# Patient Record
Sex: Female | Born: 1990 | Race: White | Hispanic: No | Marital: Single | Smoking: Former smoker
Health system: Southern US, Community
[De-identification: ages and names within clinical notes are randomized; demographics above are authoritative.]

## PROBLEM LIST (undated history)

## (undated) DIAGNOSIS — Z789 Other specified health status: Secondary | ICD-10-CM

## (undated) HISTORY — DX: Other specified health status: Z78.9

---

## 2014-11-28 LAB — OB RESULTS CONSOLE RUBELLA ANTIBODY, IGM: Rubella: IMMUNE

## 2014-11-28 LAB — OB RESULTS CONSOLE HIV ANTIBODY (ROUTINE TESTING): HIV: NONREACTIVE

## 2014-11-28 LAB — OB RESULTS CONSOLE HGB/HCT, BLOOD
HCT: 40 %
Hemoglobin: 13.4 g/dL

## 2014-11-28 LAB — OB RESULTS CONSOLE GC/CHLAMYDIA
Chlamydia: NEGATIVE
Gonorrhea: NEGATIVE

## 2014-11-28 LAB — OB RESULTS CONSOLE ABO/RH: RH TYPE: POSITIVE

## 2014-11-28 LAB — OB RESULTS CONSOLE RPR: RPR: NONREACTIVE

## 2014-11-28 LAB — OB RESULTS CONSOLE HEPATITIS B SURFACE ANTIGEN: HEP B S AG: NEGATIVE

## 2014-11-28 LAB — OB RESULTS CONSOLE ANTIBODY SCREEN: Antibody Screen: NEGATIVE

## 2015-04-06 LAB — OB RESULTS CONSOLE HGB/HCT, BLOOD: Hemoglobin: 10.4 g/dL

## 2015-05-12 ENCOUNTER — Encounter: Payer: Self-pay | Admitting: *Deleted

## 2015-05-12 DIAGNOSIS — Z3490 Encounter for supervision of normal pregnancy, unspecified, unspecified trimester: Secondary | ICD-10-CM

## 2015-05-12 DIAGNOSIS — Z98891 History of uterine scar from previous surgery: Secondary | ICD-10-CM | POA: Insufficient documentation

## 2015-05-12 DIAGNOSIS — E079 Disorder of thyroid, unspecified: Secondary | ICD-10-CM

## 2015-05-16 ENCOUNTER — Ambulatory Visit (INDEPENDENT_AMBULATORY_CARE_PROVIDER_SITE_OTHER): Payer: Medicaid Other | Admitting: Advanced Practice Midwife

## 2015-05-16 ENCOUNTER — Encounter: Payer: Self-pay | Admitting: Advanced Practice Midwife

## 2015-05-16 VITALS — BP 102/67 | HR 75 | Temp 98.5°F | Ht 63.0 in | Wt 127.5 lb

## 2015-05-16 DIAGNOSIS — Z3493 Encounter for supervision of normal pregnancy, unspecified, third trimester: Secondary | ICD-10-CM | POA: Diagnosis not present

## 2015-05-16 DIAGNOSIS — O219 Vomiting of pregnancy, unspecified: Secondary | ICD-10-CM

## 2015-05-16 DIAGNOSIS — Z23 Encounter for immunization: Secondary | ICD-10-CM | POA: Diagnosis not present

## 2015-05-16 DIAGNOSIS — O34219 Maternal care for unspecified type scar from previous cesarean delivery: Secondary | ICD-10-CM

## 2015-05-16 LAB — POCT URINALYSIS DIP (DEVICE)
Bilirubin Urine: NEGATIVE
GLUCOSE, UA: NEGATIVE mg/dL
HGB URINE DIPSTICK: NEGATIVE
KETONES UR: NEGATIVE mg/dL
Nitrite: NEGATIVE
Protein, ur: NEGATIVE mg/dL
SPECIFIC GRAVITY, URINE: 1.01 (ref 1.005–1.030)
Urobilinogen, UA: 0.2 mg/dL (ref 0.0–1.0)
pH: 7 (ref 5.0–8.0)

## 2015-05-16 MED ORDER — ONDANSETRON 4 MG PO TBDP: 4.0000 mg | ORAL_TABLET | Freq: Three times a day (TID) | ORAL | Status: DC | PRN

## 2015-05-16 MED ORDER — TETANUS-DIPHTH-ACELL PERTUSSIS 5-2.5-18.5 LF-MCG/0.5 IM SUSP
0.5000 mL | Freq: Once | INTRAMUSCULAR | Status: AC
  Administered 2015-05-16: 0.5 mL via INTRAMUSCULAR

## 2015-05-16 NOTE — Patient Instructions (Addendum)
Back Pain in Pregnancy Back pain during pregnancy is common. It happens in about half of all pregnancies. It is important for you and your baby that you remain active during your pregnancy.If you feel that back pain is not allowing you to remain active or sleep well, it is time to see your caregiver. Back pain may be caused by several factors related to changes during your pregnancy.Fortunately, unless you had trouble with your back before your pregnancy, the pain is likely to get better after you deliver. Low back pain usually occurs between the fifth and seventh months of pregnancy. It can, however, happen in the first couple months. Factors that increase the risk of back problems include:   Previous back problems.  Injury to your back.  Having twins or multiple births.  A chronic cough.  Stress.  Job-related repetitive motions.  Muscle or spinal disease in the back.  Family history of back problems, ruptured (herniated) discs, or osteoporosis.  Depression, anxiety, and panic attacks. CAUSES   When you are pregnant, your body produces a hormone called relaxin. This hormonemakes the ligaments connecting the low back and pubic bones more flexible. This flexibility allows the baby to be delivered more easily. When your ligaments are loose, your muscles need to work harder to support your back. Soreness in your back can come from tired muscles. Soreness can also come from back tissues that are irritated since they are receiving less support.  As the baby grows, it puts pressure on the nerves and blood vessels in your pelvis. This can cause back pain.  As the baby grows and gets heavier during pregnancy, the uterus pushes the stomach muscles forward and changes your center of gravity. This makes your back muscles work harder to maintain good posture. SYMPTOMS  Lumbar pain during pregnancy Lumbar pain during pregnancy usually occurs at or above the waist in the center of the back. There  may be pain and numbness that radiates into your leg or foot. This is similar to low back pain experienced by non-pregnant women. It usually increases with sitting for long periods of time, standing, or repetitive lifting. Tenderness may also be present in the muscles along your upper back. Posterior pelvic pain during pregnancy Pain in the back of the pelvis is more common than lumbar pain in pregnancy. It is a deep pain felt in your side at the waistline, or across the tailbone (sacrum), or in both places. You may have pain on one or both sides. This pain can also go into the buttocks and backs of the upper thighs. Pubic and groin pain may also be present. The pain does not quickly resolve with rest, and morning stiffness may also be present. Pelvic pain during pregnancy can be brought on by most activities. A high level of fitness before and during pregnancy may or may not prevent this problem. Labor pain is usually 1 to 2 minutes apart, lasts for about 1 minute, and involves a bearing down feeling or pressure in your pelvis. However, if you are at term with the pregnancy, constant low back pain can be the beginning of early labor, and you should be aware of this. DIAGNOSIS  X-rays of the back should not be done during the first 12 to 14 weeks of the pregnancy and only when absolutely necessary during the rest of the pregnancy. MRIs do not give off radiation and are safe during pregnancy. MRIs also should only be done when absolutely necessary. HOME CARE INSTRUCTIONS  Exercise   as directed by your caregiver. Exercise is the most effective way to prevent or manage back pain. If you have a back problem, it is especially important to avoid sports that require sudden body movements. Swimming and walking are great activities.  Do not stand in one place for long periods of time.  Do not wear high heels.  Sit in chairs with good posture. Use a pillow on your lower back if necessary. Make sure your head  rests over your shoulders and is not hanging forward.  Try sleeping on your side, preferably the left side, with a pillow or two between your legs. If you are sore after a night's rest, your bedmay betoo soft.Try placing a board between your mattress and box spring.  Listen to your body when lifting.If you are experiencing pain, ask for help or try bending yourknees more so you can use your leg muscles rather than your back muscles. Squat down when picking up something from the floor. Do not bend over.  Eat a healthy diet. Try to gain weight within your caregiver's recommendations.  Use heat or cold packs 3 to 4 times a day for 15 minutes to help with the pain.  Only take over-the-counter or prescription medicines for pain, discomfort, or fever as directed by your caregiver. Sudden (acute) back pain  Use bed rest for only the most extreme, acute episodes of back pain. Prolonged bed rest over 48 hours will aggravate your condition.  Ice is very effective for acute conditions.  Put ice in a plastic bag.  Place a towel between your skin and the bag.  Leave the ice on for 10 to 20 minutes every 2 hours, or as needed.  Using heat packs for 30 minutes prior to activities is also helpful. Continued back pain See your caregiver if you have continued problems. Your caregiver can help or refer you for appropriate physical therapy. With conditioning, most back problems can be avoided. Sometimes, a more serious issue may be the cause of back pain. You should be seen right away if new problems seem to be developing. Your caregiver may recommend:  A maternity girdle.  An elastic sling.  A back brace.  A massage therapist or acupuncture. SEEK MEDICAL CARE IF:   You are not able to do most of your daily activities, even when taking the pain medicine you were given.  You need a referral to a physical therapist or chiropractor.  You want to try acupuncture. SEEK IMMEDIATE MEDICAL CARE  IF:  You develop numbness, tingling, weakness, or problems with the use of your arms or legs.  You develop severe back pain that is no longer relieved with medicines.  You have a sudden change in bowel or bladder control.  You have increasing pain in other areas of the body.  You develop shortness of breath, dizziness, or fainting.  You develop nausea, vomiting, or sweating.  You have back pain which is similar to labor pains.  You have back pain along with your water breaking or vaginal bleeding.  You have back pain or numbness that travels down your leg.  Your back pain developed after you fell.  You develop pain on one side of your back. You may have a kidney stone.  You see blood in your urine. You may have a bladder infection or kidney stone.  You have back pain with blisters. You may have shingles. Back pain is fairly common during pregnancy but should not be accepted as just part of   the process. Back pain should always be treated as soon as possible. This will make your pregnancy as pleasant as possible.   This information is not intended to replace advice given to you by your health care provider. Make sure you discuss any questions you have with your health care provider.   Document Released: 10/15/2005 Document Revised: 09/29/2011 Document Reviewed: 11/26/2010 Elsevier Interactive Patient Education 2016 [employer].  Tdap Vaccine (Tetanus, Diphtheria and Pertussis): What You Need to Know 1. Why get vaccinated? Tetanus, diphtheria and pertussis are very serious diseases. Tdap vaccine can protect [place] from these diseases. And, Tdap vaccine given to pregnant women can protect newborn babies against pertussis. TETANUS (Lockjaw) is rare in the [place] today. It causes painful muscle tightening and stiffness, usually all over the body.  It can lead to tightening of muscles in the head and neck so you can't open your mouth, swallow, or sometimes even breathe. Tetanus  kills about 1 out of 10 people who are infected even after receiving the best medical care. DIPHTHERIA is also rare in the [place] today. It can cause a thick coating to form in the back of the throat.  It can lead to breathing problems, heart failure, paralysis, and death. PERTUSSIS (Whooping Cough) causes severe coughing spells, which can cause difficulty breathing, vomiting and disturbed sleep.  It can also lead to weight loss, incontinence, and rib fractures. Up to 2 in 100 adolescents and 5 in 100 adults with pertussis are hospitalized or have complications, which could include pneumonia or death. These diseases are caused by bacteria. Diphtheria and pertussis are spread from person to person through secretions from coughing or sneezing. Tetanus enters the body through cuts, scratches, or wounds. Before vaccines, as many as 200,000 cases of diphtheria, 200,000 cases of pertussis, and hundreds of cases of tetanus, were reported in the [place] each year. Since vaccination began, reports of cases for tetanus and diphtheria have dropped by about 99% and for pertussis by about 80%. 2. Tdap vaccine Tdap vaccine can protect adolescents and adults from tetanus, diphtheria, and pertussis. One dose of Tdap is routinely given at age 78 or 64. People who did not get Tdap at that age should get it as soon as possible. Tdap is especially important for healthcare professionals and anyone having close contact with a baby younger than 12 months. Pregnant women should get a dose of Tdap during every pregnancy, to protect the newborn from pertussis. Infants are most at risk for severe, life-threatening complications from pertussis. Another vaccine, called Td, protects against tetanus and diphtheria, but not pertussis. A Td booster should be given every 10 years. Tdap may be given as one of these boosters if you have never gotten Tdap before. Tdap may also be given after a severe cut or burn to prevent  tetanus infection. Your doctor or the person giving you the vaccine can give you more information. Tdap may safely be given at the same time as other vaccines. 3. Some people should not get this vaccine  A person who has ever had a life-threatening allergic reaction after a previous dose of any diphtheria, tetanus or pertussis containing vaccine, OR has a severe allergy to any part of this vaccine, should not get Tdap vaccine. Tell the person giving the vaccine about any severe allergies.  Anyone who had coma or long repeated seizures within 7 days after a childhood dose of DTP or DTaP, or a previous dose of Tdap, should not get Tdap,  unless a cause other than the vaccine was found. They can still get Td.  Talk to your doctor if you:  have seizures or another nervous system problem,  had severe pain or swelling after any vaccine containing diphtheria, tetanus or pertussis,  ever had a condition called Guillain-Barr Syndrome (GBS),  aren't feeling well on the day the shot is scheduled. 4. Risks With any medicine, including vaccines, there is a chance of side effects. These are usually mild and go away on their own. Serious reactions are also possible but are rare. Most people who get Tdap vaccine do not have any problems with it. Mild problems following Tdap (Did not interfere with activities)  Pain where the shot was given (about 3 in 4 adolescents or 2 in 3 adults)  Redness or swelling where the shot was given (about 1 person in 5)  Mild fever of at least 100.52F (up to about 1 in 25 adolescents or 1 in 100 adults)  Headache (about 3 or 4 people in 10)  Tiredness (about 1 person in 3 or 4)  Nausea, vomiting, diarrhea, stomach ache (up to 1 in 4 adolescents or 1 in 10 adults)  Chills, sore joints (about 1 person in 10)  Body aches (about 1 person in 3 or 4)  Rash, swollen glands (uncommon) Moderate problems following Tdap (Interfered with activities, but did not require  medical attention)  Pain where the shot was given (up to 1 in 5 or 6)  Redness or swelling where the shot was given (up to about 1 in 16 adolescents or 1 in 12 adults)  Fever over 102F (about 1 in 100 adolescents or 1 in 250 adults)  Headache (about 1 in 7 adolescents or 1 in 10 adults)  Nausea, vomiting, diarrhea, stomach ache (up to 1 or 3 people in 100)  Swelling of the entire arm where the shot was given (up to about 1 in 500). Severe problems following Tdap (Unable to perform usual activities; required medical attention)  Swelling, severe pain, bleeding and redness in the arm where the shot was given (rare). Problems that could happen after any vaccine:  People sometimes faint after a medical procedure, including vaccination. Sitting or lying down for about 15 minutes can help prevent fainting, and injuries caused by a fall. Tell your doctor if you feel dizzy, or have vision changes or ringing in the ears.  Some people get severe pain in the shoulder and have difficulty moving the arm where a shot was given. This happens very rarely.  Any medication can cause a severe allergic reaction. Such reactions from a vaccine are very rare, estimated at fewer than 1 in a million doses, and would happen within a few minutes to a few hours after the vaccination. As with any medicine, there is a very remote chance of a vaccine causing a serious injury or death. The safety of vaccines is always being monitored. For more information, visit: http://floyd.org/ 5. What if there is a serious problem? What should I look for?  Look for anything that concerns you, such as signs of a severe allergic reaction, very high fever, or unusual behavior.  Signs of a severe allergic reaction can include hives, swelling of the face and throat, difficulty breathing, a fast heartbeat, dizziness, and weakness. These would usually start a few minutes to a few hours after the vaccination. What should I  do?  If you think it is a severe allergic reaction or other emergency that can't wait,  call 9-1-1 or get the person to the nearest hospital. Otherwise, call your doctor.  Afterward, the reaction should be reported to the Vaccine Adverse Event Reporting System (VAERS). Your doctor might file this report, or you can do it yourself through the VAERS web site at www.vaers.LAgents.nohhs.gov, or by calling 1-(613)858-2492. VAERS does not give medical advice.  6. The National Vaccine Injury Compensation Program The [employer] Vaccine Injury Compensation Program (VICP) is a federal program that was created to compensate people who may have been injured by certain vaccines. Persons who believe they may have been injured by a vaccine can learn about the program and about filing a claim by calling 1-661 787 9803 or visiting the VICP website at SpiritualWord.atwww.hrsa.gov/vaccinecompensation. There is a time limit to file a claim for compensation. 7. How can I learn more?  Ask your doctor. He or she can give you the vaccine package insert or suggest other sources of information.  Call your local or state health department.  Contact the Centers for Disease Control and Prevention (CDC):  Call 450-483-67401-712-522-4098 (1-800-CDC-INFO) or  Visit CDC's website at PicCapture.uywww.cdc.gov/vaccines CDC Tdap Vaccine VIS (09/13/13)   This information is not intended to replace advice given to you by your health care provider. Make sure you discuss any questions you have with your health care provider.   Document Released: 01/06/2012 Document Revised: 07/28/2014 Document Reviewed: 10/19/2013 Elsevier Interactive Patient Education 2016 Elsevier Inc.  Pregnancy and Influenza Influenza, also called the flu, is an infection of the respiratory tract. If you are pregnant, you are more likely to catch the flu. You are also more likely to have a more serious case of the flu. This is because pregnancy lowers your body's ability to fight off infections (it weakens your  immune system). It also puts additional stress on your heart and lungs, which makes you more likely to have complications. Having a bad case of the flu, especially with a high fever, can be dangerous for your developing baby. It can cause you to go into early labor. HOW DO PEOPLE GET THE FLU? The flu is caused by the influenza virus. This virus is common every year in the fall and winter. It spreads when virus particles get passed from person to person. You can get the virus if you are near a sick person who is coughing or sneezing. You can also get the virus if you touch something that has the virus on it and then touch your face. HOW CAN I PROTECT MYSELF AGAINST THE FLU?  Get a flu shot. The best way to prevent the flu is to get a flu shot before flu season starts. The flu shot is not dangerous for your developing baby. It may even help protect your baby from the flu for up to 6 months after birth. The flu shot is one type of flu vaccine. Another type is a nasal spray vaccine. Do not get the nasal spray vaccine. It is not approved for pregnancy.  Do not come in close contact with sick people.  Do not share food, drinks, or utensils with other people.  Wash your hands often. Use hand sanitizer when soap and water are not available. WHAT SHOULD I DO IF I HAVE FLU SYMPTOMS? If you have any flu symptoms, call your health care provider right away. Flu symptoms include:  Fever or chills.  Muscle aches.  Headache.  Sore throat.  Nasal congestion.  Cough.  Feeling tired.  Loss of appetite.  Vomiting.  Diarrhea. You may be  able to take an antiviral medicine to keep the flu from becoming severe and to shorten how long it lasts. WHAT SHOULD I DO AT HOME IF I AM DIAGNOSED WITH THE FLU?  Do not take any medicine, including cold or flu medicine, unless directed by your health care provider.  If you take antiviral medicine, make sure you finish it even if you start to feel better.  Drink  enough fluid to keep your urine clear or pale yellow.  Get plenty of rest. WHEN WOULD I SEEK IMMEDIATE MEDICAL CARE IF I HAVE THE FLU?  You have trouble breathing.  You have chest pain.  You begin to have labor pains.  You have a high fever that does not go down after you take medicine.  You do not feel your baby move.  You have diarrhea or vomiting that will not go away.   This information is not intended to replace advice given to you by your health care provider. Make sure you discuss any questions you have with your health care provider.   Document Released: 05/09/2008 Document Revised: 07/12/2013 Document Reviewed: 06/03/2013 Elsevier Interactive Patient Education [employer]

## 2015-05-16 NOTE — Progress Notes (Signed)
Initial and 28 week prenatal education packets given Breastfeeding tip of the week reviewed Flu and tdap today

## 2015-05-16 NOTE — Progress Notes (Signed)
Subjective:  Sara Padilla is a [age] y.o. G3P2002 at 759w3d being seen today for new OB transfer from [place]. Records reviewed.  Patient reports low back pain.  Contractions: Not present.  Vag. Bleeding: None. Movement: Present. Denies leaking of fluid.   The following portions of the patient's history were reviewed and updated as appropriate: allergies, current medications, past family history, past medical history, past social history, past surgical history and problem list. Problem list updated.  1 hour GTT elevated at 135.   Objective:   Filed Vitals:   05/16/15 0834 05/16/15 0839  BP: 102/67   Pulse: 75   Temp: 98.5 F (36.9 C)   Height:  5\' 3"  (1.6 m)  Weight: 127 lb 8 oz (57.834 kg)     Fetal Status: Fetal Heart Rate (bpm): 148   Movement: Present     General:  Alert, oriented and cooperative. Patient is in no acute distress.  Skin: Skin is warm and dry. No rash noted.   Cardiovascular: Normal heart rate noted  Respiratory: Normal respiratory effort, no problems with respiration noted  Abdomen: Soft, gravid, appropriate for gestational age. Pain/Pressure: Present     Pelvic: Vag. Bleeding: None     Cervical exam deferred        Extremities: Normal range of motion.  Edema: None  Mental Status: Normal mood and affect. Normal behavior. Normal judgment and thought content.   Urinalysis: Urine Protein: Negative Urine Glucose: Negative  Assessment and Plan:  Pregnancy: G3P2002 at 1079w3d  1. Supervision of normal pregnancy, third trimester   2. Previous cesarean delivery, antepartum condition or complication  Scheduled 3 hour GTT, RPR, HIV Preterm labor symptoms and general obstetric precautions including but not limited to vaginal bleeding, contractions, leaking of fluid and fetal movement were reviewed in detail with the patient. Please refer to After Visit Summary for other counseling recommendations.  Return in about 2 weeks (around 05/30/2015) for 3 hour GTT ASAP.   Back pain comfort measures. Maternity support. TDaP and Flu  Dorathy KinsmanVirginia Jeanie Mccard, CNM

## 2015-05-18 ENCOUNTER — Other Ambulatory Visit: Payer: Medicaid Other

## 2015-05-18 DIAGNOSIS — O24112 Pre-existing diabetes mellitus, type 2, in pregnancy, second trimester: Secondary | ICD-10-CM

## 2015-05-19 LAB — GLUCOSE TOLERANCE, 3 HOURS
GLUCOSE, 1 HOUR-GESTATIONAL: 115 mg/dL (ref 70–189)
Glucose Tolerance, 2 hour: 105 mg/dL (ref 70–164)
Glucose Tolerance, Fasting: 77 mg/dL (ref 65–99)
Glucose, GTT - 3 Hour: 83 mg/dL (ref 70–144)

## 2015-05-30 ENCOUNTER — Ambulatory Visit (INDEPENDENT_AMBULATORY_CARE_PROVIDER_SITE_OTHER): Payer: Medicaid Other | Admitting: Certified Nurse Midwife

## 2015-05-30 VITALS — BP 110/73 | HR 89 | Temp 98.1°F | Wt 127.4 lb

## 2015-05-30 DIAGNOSIS — O26843 Uterine size-date discrepancy, third trimester: Secondary | ICD-10-CM

## 2015-05-30 DIAGNOSIS — O1213 Gestational proteinuria, third trimester: Secondary | ICD-10-CM

## 2015-05-30 DIAGNOSIS — O365930 Maternal care for other known or suspected poor fetal growth, third trimester, not applicable or unspecified: Secondary | ICD-10-CM

## 2015-05-30 DIAGNOSIS — O1210 Gestational proteinuria, unspecified trimester: Secondary | ICD-10-CM

## 2015-05-30 LAB — POCT URINALYSIS DIP (DEVICE)
Glucose, UA: NEGATIVE mg/dL
HGB URINE DIPSTICK: NEGATIVE
Ketones, ur: NEGATIVE mg/dL
NITRITE: NEGATIVE
PH: 7.5 (ref 5.0–8.0)
PROTEIN: 100 mg/dL — AB
SPECIFIC GRAVITY, URINE: 1.02 (ref 1.005–1.030)
UROBILINOGEN UA: 0.2 mg/dL (ref 0.0–1.0)

## 2015-05-30 LAB — CBC
HCT: 29.4 % — ABNORMAL LOW (ref 36.0–46.0)
Hemoglobin: 10.1 g/dL — ABNORMAL LOW (ref 12.0–15.0)
MCH: 30.7 pg (ref 26.0–34.0)
MCHC: 34.4 g/dL (ref 30.0–36.0)
MCV: 89.4 fL (ref 78.0–100.0)
MPV: 8.9 fL (ref 8.6–12.4)
Platelets: 361 10*3/uL (ref 150–400)
RBC: 3.29 MIL/uL — ABNORMAL LOW (ref 3.87–5.11)
RDW: 13.1 % (ref 11.5–15.5)
WBC: 13.8 10*3/uL — ABNORMAL HIGH (ref 4.0–10.5)

## 2015-05-30 LAB — COMPLETE METABOLIC PANEL WITH GFR
ALT: 6 U/L (ref 6–29)
AST: 11 U/L (ref 10–30)
Albumin: 3.5 g/dL — ABNORMAL LOW (ref 3.6–5.1)
Alkaline Phosphatase: 183 U/L — ABNORMAL HIGH (ref 33–115)
BUN: 4 mg/dL — ABNORMAL LOW (ref 7–25)
CO2: 25 mmol/L (ref 20–31)
Calcium: 8.6 mg/dL (ref 8.6–10.2)
Chloride: 102 mmol/L (ref 98–110)
Creat: 0.43 mg/dL — ABNORMAL LOW (ref 0.50–1.10)
GFR, Est African American: >89 mL/min (ref >=60)
GFR, Est Non African American: >89 mL/min (ref >=60)
Glucose, Bld: 81 mg/dL (ref 65–99)
Potassium: 4.4 mmol/L (ref 3.5–5.3)
Sodium: 135 mmol/L (ref 135–146)
Total Bilirubin: 0.3 mg/dL (ref 0.2–1.2)
Total Protein: 6.3 g/dL (ref 6.1–8.1)

## 2015-05-30 NOTE — Progress Notes (Cosign Needed)
Subjective:  Sara Padilla is a 24 y.o. G3P2002 at 3124w3d being seen today for ongoing prenatal care.  Patient reports Mild intermittent pain in her side and mild fatigue which has been present for a few weeks.  Contractions: Irregular.  Vag. Bleeding: None. Movement: Present. Denies leaking of fluid.   History of "Tyroid disease" as per chart: Patient doesn't know if hypo or hyper. States that 2-3 years ago she was taking thyroid medication but does not know what it was. Currently she does have some cold and hot intolerance, fatigue, difficulty gaining weight, lack of appetite and some mild intermittent anxiety. She denies any depression or diarrhea/constipation.   The patient does suffer from some minor chronic headaches but denies blurred vision, diplopia, dizziness, balance issues, weakness, abdominal pain, bloody/black stool, palpitations, chest pain, shortness of breath, cough, dysuria, abnormal urinary frequency, vaginal discharge/odor/itchiness/fluid leakage/blood. She does have sporadic contractions that come and go infrequently.   The following portions of the patient's history were reviewed and updated as appropriate: allergies, current medications, past family history, past medical history, past social history, past surgical history and problem list. Problem list updated.  Objective:   Filed Vitals:   05/30/15 0854  BP: 110/73  Pulse: 89  Temp: 98.1 F (36.7 C)  Weight: 127 lb 6.4 oz (57.788 kg)    Fetal Status: Fetal Heart Rate (bpm): 158 Fundal Height: 28 cm Movement: Present     General:  Alert, oriented and cooperative. Patient is in no acute distress.  Skin: Skin is warm and dry. No rash noted.   Cardiovascular: Normal heart rate noted  Respiratory: Normal respiratory effort, no problems with respiration noted  Abdomen: Soft, gravid, appropriate for gestational age. Pain/Pressure: Present     Pelvic: Vag. Bleeding: None     Cervical exam deferred        Extremities:  Normal range of motion.  Edema: None  Mental Status: Normal mood and affect. Normal behavior. Normal judgment and thought content.        Neurological:  Reflexes 2+, no clonus, CN2-12 intact, normal strength and ROM in extremities. No tremor noted  Urinalysis: Urine Protein: 2+ Urine Glucose: Negative  Assessment and Plan:  Pregnancy: G3P2002 at 1924w3d  1. Proteinuria affecting pregnancy: - CBC - COMPLETE METABOLIC PANEL WITH GFR - Protein / Creatinine Ratio, Urine - Thyroid Panel [id number]  2. Significant discrepancy between uterine size and clinical dates, antepartum, third trimester - US MFM OB FOLLOW UP; Future  Term labor symptoms and general obstetric precautions including but not limited to vaginal bleeding, contractions, leaking of fluid and fetal movement were reviewed in detail with the patient. Please refer to After Visit Summary for other counseling recommendations.  Return in about 2 weeks (around 06/13/2015).   Janett LabellaJohannes L Du Pisanie, Med Student

## 2015-05-30 NOTE — Progress Notes (Signed)
Breastfeeding tip of the week reviewed. 

## 2015-05-30 NOTE — Patient Instructions (Signed)
Thanks for coming in today. We did some blood tests to see if there is anything wrong because you had some protein in your urine We scheduled you for an ultrasound to check on the size of the baby. Thank you again for coming in and see you back in 2 weeks.   Third Trimester of Pregnancy The third trimester is from week 29 through week 42, months 7 through 9. The third trimester is a time when the fetus is growing rapidly. At the end of the ninth month, the fetus is about 20 inches in length and weighs 6-10 pounds.  BODY CHANGES Your body goes through many changes during pregnancy. The changes vary from woman to woman.   Your weight will continue to increase. You can expect to gain 25-35 pounds (11-16 kg) by the end of the pregnancy.  You may begin to get stretch marks on your hips, abdomen, and breasts.  You may urinate more often because the fetus is moving lower into your pelvis and pressing on your bladder.  You may develop or continue to have heartburn as a result of your pregnancy.  You may develop constipation because certain hormones are causing the muscles that push waste through your intestines to slow down.  You may develop hemorrhoids or swollen, bulging veins (varicose veins).  You may have pelvic pain because of the weight gain and pregnancy hormones relaxing your joints between the bones in your pelvis. Backaches may result from overexertion of the muscles supporting your posture.  You may have changes in your hair. These can include thickening of your hair, rapid growth, and changes in texture. Some women also have hair loss during or after pregnancy, or hair that feels dry or thin. Your hair will most likely return to normal after your baby is born.  Your breasts will continue to grow and be tender. A yellow discharge may leak from your breasts called colostrum.  Your belly button may stick out.  You may feel short of breath because of your expanding uterus.  You may  notice the fetus "dropping," or moving lower in your abdomen.  You may have a bloody mucus discharge. This usually occurs a few days to a week before labor begins.  Your cervix becomes thin and soft (effaced) near your due date. WHAT TO EXPECT AT YOUR PRENATAL EXAMS  You will have prenatal exams every 2 weeks until week 36. Then, you will have weekly prenatal exams. During a routine prenatal visit:  You will be weighed to make sure you and the fetus are growing normally.  Your blood pressure is taken.  Your abdomen will be measured to track your baby's growth.  The fetal heartbeat will be listened to.  Any test results from the previous visit will be discussed.  You may have a cervical check near your due date to see if you have effaced. At around 36 weeks, your caregiver will check your cervix. At the same time, your caregiver will also perform a test on the secretions of the vaginal tissue. This test is to determine if a type of bacteria, Group B streptococcus, is present. Your caregiver will explain this further. Your caregiver may ask you:  What your birth plan is.  How you are feeling.  If you are feeling the baby move.  If you have had any abnormal symptoms, such as leaking fluid, bleeding, severe headaches, or abdominal cramping.  If you are using any tobacco products, including cigarettes, chewing tobacco, and electronic cigarettes.  If you have any questions. Other tests or screenings that may be performed during your third trimester include:  Blood tests that check for low iron levels (anemia).  Fetal testing to check the health, activity level, and growth of the fetus. Testing is done if you have certain medical conditions or if there are problems during the pregnancy.  HIV (human immunodeficiency virus) testing. If you are at high risk, you may be screened for HIV during your third trimester of pregnancy. FALSE LABOR You may feel small, irregular contractions that  eventually go away. These are called Braxton Hicks contractions, or false labor. Contractions may last for hours, days, or even weeks before true labor sets in. If contractions come at regular intervals, intensify, or become painful, it is best to be seen by your caregiver.  SIGNS OF LABOR   Menstrual-like cramps.  Contractions that are 5 minutes apart or less.  Contractions that start on the top of the uterus and spread down to the lower abdomen and back.  A sense of increased pelvic pressure or back pain.  A watery or bloody mucus discharge that comes from the vagina. If you have any of these signs before the 37th week of pregnancy, call your caregiver right away. You need to go to the hospital to get checked immediately. HOME CARE INSTRUCTIONS   Avoid all smoking, herbs, alcohol, and unprescribed drugs. These chemicals affect the formation and growth of the baby.  Do not use any tobacco products, including cigarettes, chewing tobacco, and electronic cigarettes. If you need help quitting, ask your health care provider. You may receive counseling support and other resources to help you quit.  Follow your caregiver's instructions regarding medicine use. There are medicines that are either safe or unsafe to take during pregnancy.  Exercise only as directed by your caregiver. Experiencing uterine cramps is a good sign to stop exercising.  Continue to eat regular, healthy meals.  Wear a good support bra for breast tenderness.  Do not use hot tubs, steam rooms, or saunas.  Wear your seat belt at all times when driving.  Avoid raw meat, uncooked cheese, cat litter boxes, and soil used by cats. These carry germs that can cause birth defects in the baby.  Take your prenatal vitamins.  Take 1500-2000 mg of calcium daily starting at the 20th week of pregnancy until you deliver your baby.  Try taking a stool softener (if your caregiver approves) if you develop constipation. Eat more  high-fiber foods, such as fresh vegetables or fruit and whole grains. Drink plenty of fluids to keep your urine clear or pale yellow.  Take warm sitz baths to soothe any pain or discomfort caused by hemorrhoids. Use hemorrhoid cream if your caregiver approves.  If you develop varicose veins, wear support hose. Elevate your feet for 15 minutes, 3-4 times a day. Limit salt in your diet.  Avoid heavy lifting, wear low heal shoes, and practice good posture.  Rest a lot with your legs elevated if you have leg cramps or low back pain.  Visit your dentist if you have not gone during your pregnancy. Use a soft toothbrush to brush your teeth and be gentle when you floss.  A sexual relationship may be continued unless your caregiver directs you otherwise.  Do not travel far distances unless it is absolutely necessary and only with the approval of your caregiver.  Take prenatal classes to understand, practice, and ask questions about the labor and delivery.  Make a trial run  to the hospital.  Pack your hospital bag.  Prepare the baby's nursery.  Continue to go to all your prenatal visits as directed by your caregiver. SEEK MEDICAL CARE IF:  You are unsure if you are in labor or if your water has broken.  You have dizziness.  You have mild pelvic cramps, pelvic pressure, or nagging pain in your abdominal area.  You have persistent nausea, vomiting, or diarrhea.  You have a bad smelling vaginal discharge.  You have pain with urination. SEEK IMMEDIATE MEDICAL CARE IF:   You have a fever.  You are leaking fluid from your vagina.  You have spotting or bleeding from your vagina.  You have severe abdominal cramping or pain.  You have rapid weight loss or gain.  You have shortness of breath with chest pain.  You notice sudden or extreme swelling of your face, hands, ankles, feet, or legs.  You have not felt your baby move in over an hour.  You have severe headaches that do not go  away with medicine.  You have vision changes.   This information is not intended to replace advice given to you by your health care provider. Make sure you discuss any questions you have with your health care provider.   Document Released: 07/01/2001 Document Revised: 07/28/2014 Document Reviewed: 09/07/2012 Elsevier Interactive Patient Education [employer]

## 2015-05-30 NOTE — Progress Notes (Signed)
Student note reviewed. Agrred with assessment. Present in room during visit Illene BolusLori Rashauna Tep CNM

## 2015-05-31 LAB — PROTEIN / CREATININE RATIO, URINE
Creatinine, Urine: 240 mg/dL (ref 20–320)
Protein Creatinine Ratio: 613 mg/g creat — ABNORMAL HIGH (ref 21–161)
Total Protein, Urine: 147 mg/dL — ABNORMAL HIGH (ref 5–24)

## 2015-05-31 LAB — THYROID PANEL CP3058
Free T4: 0.85 ng/dL (ref 0.80–1.80)
T3, Free: 3.2 pg/mL (ref 2.3–4.2)
TSH: 1.445 u[IU]/mL (ref 0.350–4.500)
Thyroglobulin Ab: <1 IU/mL (ref <2)
Thyroperoxidase Ab SerPl-aCnc: 14 IU/mL — ABNORMAL HIGH (ref <9)

## 2015-06-05 ENCOUNTER — Ambulatory Visit (HOSPITAL_COMMUNITY)
Admission: RE | Admit: 2015-06-05 | Discharge: 2015-06-05 | Disposition: A | Discharge status: Home / Self Care | Payer: Medicaid Other | Source: Ambulatory Visit | Attending: Certified Nurse Midwife | Admitting: Certified Nurse Midwife

## 2015-06-05 ENCOUNTER — Other Ambulatory Visit: Payer: Self-pay | Admitting: Certified Nurse Midwife

## 2015-06-05 DIAGNOSIS — O26843 Uterine size-date discrepancy, third trimester: Secondary | ICD-10-CM | POA: Diagnosis not present

## 2015-06-05 DIAGNOSIS — E079 Disorder of thyroid, unspecified: Secondary | ICD-10-CM

## 2015-06-05 DIAGNOSIS — Z3A35 35 weeks gestation of pregnancy: Secondary | ICD-10-CM | POA: Diagnosis not present

## 2015-06-05 DIAGNOSIS — O1210 Gestational proteinuria, unspecified trimester: Secondary | ICD-10-CM

## 2015-06-05 DIAGNOSIS — O34219 Maternal care for unspecified type scar from previous cesarean delivery: Secondary | ICD-10-CM

## 2015-06-05 DIAGNOSIS — Z3689 Encounter for other specified antenatal screening: Secondary | ICD-10-CM

## 2015-06-05 DIAGNOSIS — Z36 Encounter for antenatal screening of mother: Secondary | ICD-10-CM | POA: Diagnosis present

## 2015-06-05 DIAGNOSIS — O99280 Endocrine, nutritional and metabolic diseases complicating pregnancy, unspecified trimester: Secondary | ICD-10-CM

## 2015-06-05 DIAGNOSIS — O1213 Gestational proteinuria, third trimester: Secondary | ICD-10-CM | POA: Insufficient documentation

## 2015-06-12 ENCOUNTER — Telehealth: Payer: Self-pay | Admitting: *Deleted

## 2015-06-12 NOTE — Telephone Encounter (Signed)
Per Illene BolusLori Clemmons, CNM on 06/11/15  need to collect 24 hour urine appointment . Called [facility] to notify her and tell her since she has an appointment tomorrow we will give her instructions then and have her bring the urine back next week since we are closed for the holiday Thursday and Friday this week.

## 2015-06-13 ENCOUNTER — Encounter (HOSPITAL_COMMUNITY): Payer: Self-pay | Admitting: *Deleted

## 2015-06-13 ENCOUNTER — Ambulatory Visit (INDEPENDENT_AMBULATORY_CARE_PROVIDER_SITE_OTHER): Payer: Medicaid Other | Admitting: Advanced Practice Midwife

## 2015-06-13 ENCOUNTER — Other Ambulatory Visit (HOSPITAL_COMMUNITY)
Admission: RE | Admit: 2015-06-13 | Discharge: 2015-06-13 | Disposition: A | Discharge status: Home / Self Care | Payer: Medicaid Other | Source: Ambulatory Visit | Attending: Advanced Practice Midwife | Admitting: Advanced Practice Midwife

## 2015-06-13 VITALS — BP 114/68 | HR 87 | Temp 98.4°F | Wt 131.0 lb

## 2015-06-13 DIAGNOSIS — O34219 Maternal care for unspecified type scar from previous cesarean delivery: Secondary | ICD-10-CM | POA: Diagnosis not present

## 2015-06-13 DIAGNOSIS — O1213 Gestational proteinuria, third trimester: Secondary | ICD-10-CM

## 2015-06-13 DIAGNOSIS — O365930 Maternal care for other known or suspected poor fetal growth, third trimester, not applicable or unspecified: Secondary | ICD-10-CM

## 2015-06-13 DIAGNOSIS — Z113 Encounter for screening for infections with a predominantly sexual mode of transmission: Secondary | ICD-10-CM | POA: Diagnosis not present

## 2015-06-13 DIAGNOSIS — Z3493 Encounter for supervision of normal pregnancy, unspecified, third trimester: Secondary | ICD-10-CM

## 2015-06-13 LAB — POCT URINALYSIS DIP (DEVICE)
Bilirubin Urine: NEGATIVE
Glucose, UA: NEGATIVE mg/dL
HGB URINE DIPSTICK: NEGATIVE
KETONES UR: NEGATIVE mg/dL
Leukocytes, UA: NEGATIVE
Nitrite: NEGATIVE
PH: 6.5 (ref 5.0–8.0)
PROTEIN: NEGATIVE mg/dL
Specific Gravity, Urine: 1.01 (ref 1.005–1.030)
Urobilinogen, UA: 0.2 mg/dL (ref 0.0–1.0)

## 2015-06-13 NOTE — Patient Instructions (Addendum)
Braxton Hicks Contractions Contractions of the uterus can occur throughout pregnancy. Contractions are not always a sign that you are in labor.  WHAT ARE BRAXTON HICKS CONTRACTIONS?  Contractions that occur before labor are called Braxton Hicks contractions, or false labor. Toward the end of pregnancy (32-34 weeks), these contractions can develop more often and may become more forceful. This is not true labor because these contractions do not result in opening (dilatation) and thinning of the cervix. They are sometimes difficult to tell apart from true labor because these contractions can be forceful and people have different pain tolerances. You should not feel embarrassed if you go to the hospital with false labor. Sometimes, the only way to tell if you are in true labor is for your health care provider to look for changes in the cervix. If there are no prenatal problems or other health problems associated with the pregnancy, it is completely safe to be sent home with false labor and await the onset of true labor. HOW CAN YOU TELL THE DIFFERENCE BETWEEN TRUE AND FALSE LABOR? False Labor  The contractions of false labor are usually shorter and not as hard as those of true labor.   The contractions are usually irregular.   The contractions are often felt in the front of the lower abdomen and in the groin.   The contractions may go away when you walk around or change positions while lying down.   The contractions get weaker and are shorter lasting as time goes on.   The contractions do not usually become progressively stronger, regular, and closer together as with true labor.  True Labor  Contractions in true labor last 30-70 seconds, become very regular, usually become more intense, and increase in frequency.   The contractions do not go away with walking.   The discomfort is usually felt in the top of the uterus and spreads to the lower abdomen and low back.   True labor can be  determined by your health care provider with an exam. This will show that the cervix is dilating and getting thinner.  WHAT TO REMEMBER  Keep up with your usual exercises and follow other instructions given by your health care provider.   Take medicines as directed by your health care provider.   Keep your regular prenatal appointments.   Eat and drink lightly if you think you are going into labor.   If Braxton Hicks contractions are making you uncomfortable:   Change your position from lying down or resting to walking, or from walking to resting.   Sit and rest in a tub of warm water.   Drink 2-3 glasses of water. Dehydration may cause these contractions.   Do slow and deep breathing several times an hour.  WHEN SHOULD I SEEK IMMEDIATE MEDICAL CARE? Seek immediate medical care if:  Your contractions become stronger, more regular, and closer together.   You have fluid leaking or gushing from your vagina.   You have a fever.   You pass blood-tinged mucus.   You have vaginal bleeding.   You have continuous abdominal pain.   You have low back pain that you never had before.   You feel your baby's head pushing down and causing pelvic pressure.   Your baby is not moving as much as it used to.    This information is not intended to replace advice given to you by your health care provider. Make sure you discuss any questions you have with your health care  provider.   Document Released: 07/07/2005 Document Revised: 07/12/2013 Document Reviewed: 04/18/2013 Elsevier Interactive Patient Education [employer]   Contraception Choices Contraception (birth control) is the use of any methods or devices to prevent pregnancy. Below are some methods to help avoid pregnancy. HORMONAL METHODS   Contraceptive implant. This is a thin, plastic tube containing progesterone hormone. It does not contain estrogen hormone. Your health care provider inserts the tube in  the inner part of the upper arm. The tube can remain in place for up to 3 years. After 3 years, the implant must be removed. The implant prevents the ovaries from releasing an egg (ovulation), thickens the cervical mucus to prevent sperm from entering the uterus, and thins the lining of the inside of the uterus.  Progesterone-only injections. These injections are given every 3 months by your health care provider to prevent pregnancy. This synthetic progesterone hormone stops the ovaries from releasing eggs. It also thickens cervical mucus and changes the uterine lining. This makes it harder for sperm to survive in the uterus.  Birth control pills. These pills contain estrogen and progesterone hormone. They work by preventing the ovaries from releasing eggs (ovulation). They also cause the cervical mucus to thicken, preventing the sperm from entering the uterus. Birth control pills are prescribed by a health care provider.Birth control pills can also be used to treat heavy periods.  Minipill. This type of birth control pill contains only the progesterone hormone. They are taken every day of each month and must be prescribed by your health care provider.  Birth control patch. The patch contains hormones similar to those in birth control pills. It must be changed once a week and is prescribed by a health care provider.  Vaginal ring. The ring contains hormones similar to those in birth control pills. It is left in the vagina for 3 weeks, removed for 1 week, and then a new one is put back in place. The patient must be comfortable inserting and removing the ring from the vagina.A health care provider's prescription is necessary.  Emergency contraception. Emergency contraceptives prevent pregnancy after unprotected sexual intercourse. This pill can be taken right after sex or up to 5 days after unprotected sex. It is most effective the sooner you take the pills after having sexual intercourse. Most emergency  contraceptive pills are available without a prescription. Check with your pharmacist. Do not use emergency contraception as your only form of birth control. BARRIER METHODS   Female condom. This is a thin sheath (latex or rubber) that is worn over the penis during sexual intercourse. It can be used with spermicide to increase effectiveness.  Female condom. This is a soft, loose-fitting sheath that is put into the vagina before sexual intercourse.  Diaphragm. This is a soft, latex, dome-shaped barrier that must be fitted by a health care provider. It is inserted into the vagina, along with a spermicidal jelly. It is inserted before intercourse. The diaphragm should be left in the vagina for 6 to 8 hours after intercourse.  Cervical cap. This is a round, soft, latex or plastic cup that fits over the cervix and must be fitted by a health care provider. The cap can be left in place for up to 48 hours after intercourse.  Sponge. This is a soft, circular piece of polyurethane foam. The sponge has spermicide in it. It is inserted into the vagina after wetting it and before sexual intercourse.  Spermicides. These are chemicals that kill or block sperm from  entering the cervix and uterus. They come in the form of creams, jellies, suppositories, foam, or tablets. They do not require a prescription. They are inserted into the vagina with an applicator before having sexual intercourse. The process must be repeated every time you have sexual intercourse. INTRAUTERINE CONTRACEPTION  Intrauterine device (IUD). This is a T-shaped device that is put in a woman's uterus during a menstrual period to prevent pregnancy. There are 2 types:  Copper IUD. This type of IUD is wrapped in copper wire and is placed inside the uterus. Copper makes the uterus and fallopian tubes produce a fluid that kills sperm. It can stay in place for 10 years.  Hormone IUD. This type of IUD contains the hormone progestin (synthetic  progesterone). The hormone thickens the cervical mucus and prevents sperm from entering the uterus, and it also thins the uterine lining to prevent implantation of a fertilized egg. The hormone can weaken or kill the sperm that get into the uterus. It can stay in place for 3-5 years, depending on which type of IUD is used. PERMANENT METHODS OF CONTRACEPTION  Female tubal ligation. This is when the woman's fallopian tubes are surgically sealed, tied, or blocked to prevent the egg from traveling to the uterus.  Hysteroscopic sterilization. This involves placing a small coil or insert into each fallopian tube. Your doctor uses a technique called hysteroscopy to do the procedure. The device causes scar tissue to form. This results in permanent blockage of the fallopian tubes, so the sperm cannot fertilize the egg. It takes about 3 months after the procedure for the tubes to become blocked. You must use another form of birth control for these 3 months.  Female sterilization. This is when the female has the tubes that carry sperm tied off (vasectomy).This blocks sperm from entering the vagina during sexual intercourse. After the procedure, the man can still ejaculate fluid (semen). NATURAL PLANNING METHODS  Natural family planning. This is not having sexual intercourse or using a barrier method (condom, diaphragm, cervical cap) on days the woman could become pregnant.  Calendar method. This is keeping track of the length of each menstrual cycle and identifying when you are fertile.  Ovulation method. This is avoiding sexual intercourse during ovulation.  Symptothermal method. This is avoiding sexual intercourse during ovulation, using a thermometer and ovulation symptoms.  Post-ovulation method. This is timing sexual intercourse after you have ovulated. Regardless of which type or method of contraception you choose, it is important that you use condoms to protect against the transmission of sexually  transmitted infections (STIs). Talk with your health care provider about which form of contraception is most appropriate for you.   This information is not intended to replace advice given to you by your health care provider. Make sure you discuss any questions you have with your health care provider.   Document Released: 07/07/2005 Document Revised: 07/12/2013 Document Reviewed: 12/30/2012 Elsevier Interactive Patient Education [employer]

## 2015-06-13 NOTE — Progress Notes (Signed)
Subjective:  Sara Padilla is a 24 y.o. G3P2002 at 6237w3d being seen today for ongoing prenatal care.  She is currently monitored for the following issues for this low-risk pregnancy: Patient Active Problem List   Diagnosis Date Noted  . Proteinuria affecting pregnancy in third trimester 05/30/2015  . Small-for-dates fetus, third trimester 05/30/2015  . Previous cesarean delivery, antepartum condition or complication 05/16/2015  . Supervision of normal pregnancy 05/12/2015  . Thyroid disorder 05/12/2015   Patient reports occasional contractions.  Contractions: Irregular.  .  Movement: Present. Denies leaking of fluid.   The following portions of the patient's history were reviewed and updated as appropriate: allergies, current medications, past family history, past medical history, past social history, past surgical history and problem list. Problem list updated.  Objective:   Filed Vitals:   06/13/15 0810  BP: 114/68  Pulse: 87  Temp: 98.4 F (36.9 C)  Weight: 131 lb (59.421 kg)    Fetal Status: Fetal Heart Rate (bpm): 138   Movement: Present     General:  Alert, oriented and cooperative. Patient is in no acute distress.  Skin: Skin is warm and dry. No rash noted.   Cardiovascular: Normal heart rate noted  Respiratory: Normal respiratory effort, no problems with respiration noted  Abdomen: Soft, gravid, appropriate for gestational age. Pain/Pressure: Present     Pelvic:       Cervical exam performed      closed and long  Extremities: Normal range of motion.  Edema: Trace  Mental Status: Normal mood and affect. Normal behavior. Normal judgment and thought content.   Urinalysis: Urine Protein: Negative Urine Glucose: Negative   Growth US 21 % tile. Likely constitutional. F/U as clinically indicated.   Assessment and Plan:  Pregnancy: G3P2002 at 5937w3d  1. Proteinuria affecting pregnancy in third trimester  - Culture, beta strep (group b only) - [facility]Chlamydia [facility]  2. Small-for-dates fetus, third trimester   3. Supervision of normal pregnancy, third trimester   4. Previous cesarean delivery, antepartum condition or complication   Term labor symptoms and general obstetric precautions including but not limited to vaginal bleeding, contractions, leaking of fluid and fetal movement were reviewed in detail with the patient. Please refer to After Visit Summary for other counseling recommendations.  In-basket message sent to scheduled repeat C/S at 39 weeks. 24 hour urine supplies given. HIV, RPR to be drawn w/ 24 hour urine 06/18/15. GBS. GC/Chlamydia collected.  Return in about 1 week (around 06/20/2015).   Dorathy KinsmanVirginia Adrin Julian, CNM

## 2015-06-14 LAB — GC/CHLAMYDIA PROBE AMP (~~LOC~~) NOT AT ARMC
CHLAMYDIA, DNA PROBE: NEGATIVE
NEISSERIA GONORRHEA: NEGATIVE

## 2015-06-15 LAB — CULTURE, BETA STREP (GROUP B ONLY)

## 2015-06-18 LAB — COMPREHENSIVE METABOLIC PANEL
ALBUMIN: 3.6 g/dL (ref 3.6–5.1)
ALK PHOS: 227 U/L — AB (ref 33–115)
ALT: 7 U/L (ref 6–29)
AST: 11 U/L (ref 10–30)
BILIRUBIN TOTAL: 0.3 mg/dL (ref 0.2–1.2)
BUN: 4 mg/dL — ABNORMAL LOW (ref 7–25)
CALCIUM: 9 mg/dL (ref 8.6–10.2)
CO2: 23 mmol/L (ref 20–31)
Chloride: 105 mmol/L (ref 98–110)
Creat: 0.32 mg/dL — ABNORMAL LOW (ref 0.50–1.10)
GLUCOSE: 81 mg/dL (ref 65–99)
Potassium: 4.1 mmol/L (ref 3.5–5.3)
Sodium: 135 mmol/L (ref 135–146)
Total Protein: 6.3 g/dL (ref 6.1–8.1)

## 2015-06-18 LAB — HIV ANTIBODY (ROUTINE TESTING W REFLEX): HIV: NONREACTIVE

## 2015-06-18 LAB — RPR

## 2015-06-19 ENCOUNTER — Encounter: Payer: Self-pay | Admitting: Advanced Practice Midwife

## 2015-06-19 LAB — PROTEIN, URINE, 24 HOUR: Protein, Urine: <4 mg/dL — ABNORMAL LOW (ref 5–24)

## 2015-06-20 ENCOUNTER — Ambulatory Visit (INDEPENDENT_AMBULATORY_CARE_PROVIDER_SITE_OTHER): Payer: Medicaid Other | Admitting: Advanced Practice Midwife

## 2015-06-20 VITALS — BP 109/76 | HR 110 | Temp 98.7°F | Wt 129.2 lb

## 2015-06-20 DIAGNOSIS — O99613 Diseases of the digestive system complicating pregnancy, third trimester: Secondary | ICD-10-CM

## 2015-06-20 DIAGNOSIS — K219 Gastro-esophageal reflux disease without esophagitis: Secondary | ICD-10-CM

## 2015-06-20 DIAGNOSIS — Z3493 Encounter for supervision of normal pregnancy, unspecified, third trimester: Secondary | ICD-10-CM

## 2015-06-20 LAB — POCT URINALYSIS DIP (DEVICE)
Bilirubin Urine: NEGATIVE
Glucose, UA: NEGATIVE mg/dL
Hgb urine dipstick: NEGATIVE
Ketones, ur: NEGATIVE mg/dL
Leukocytes, UA: NEGATIVE
Nitrite: NEGATIVE
Protein, ur: NEGATIVE mg/dL
Specific Gravity, Urine: 1.015 (ref 1.005–1.030)
Urobilinogen, UA: 0.2 mg/dL (ref 0.0–1.0)
pH: 6.5 (ref 5.0–8.0)

## 2015-06-20 MED ORDER — FAMOTIDINE 20 MG PO TABS: 20.0000 mg | ORAL_TABLET | Freq: Two times a day (BID) | ORAL | Status: DC

## 2015-06-20 NOTE — Progress Notes (Signed)
Pt reports URI sx for 3 days - stuffy head, hot flashes, sore throat.  She also has no appetite.  24 HR urine collection completed 11/28.

## 2015-06-20 NOTE — Progress Notes (Signed)
Subjective:  Sara JerichoKayla Padilla is a 24 y.o. G3P2002 at 6234w3d being seen today for ongoing prenatal care.  She is currently monitored for the following issues for this low-risk pregnancy and has Supervision of normal pregnancy; Thyroid disorder; Previous cesarean delivery, antepartum condition or complication; Proteinuria affecting pregnancy in third trimester; and Uterine size-date discrepancy in third trimester, antepartum on her problem list.  Patient reports URI Sx, nausea w/out vomiting, reflux. No fever. .  Contractions: Irregular. Vag. Bleeding: None.  Movement: Present. Denies leaking of fluid.   The following portions of the patient's history were reviewed and updated as appropriate: allergies, current medications, past family history, past medical history, past social history, past surgical history and problem list. Problem list updated.  Objective:   Filed Vitals:   06/20/15 1057  BP: 109/76  Pulse: 110  Temp: 98.7 F (37.1 C)  Weight: 129 lb 3.2 oz (58.605 kg)    Fetal Status: Fetal Heart Rate (bpm): 128   Movement: Present     General:  Alert, oriented and cooperative. Patient is in no acute distress.  Skin: Skin is warm and dry. No rash noted.   Cardiovascular: Normal heart rate noted  Respiratory: Normal respiratory effort, no problems with respiration noted  Abdomen: Soft, gravid, appropriate for gestational age. Pain/Pressure: Present     Pelvic: Vag. Bleeding: None     Cervical exam deferred        Extremities: Normal range of motion.  Edema: None  Mental Status: Normal mood and affect. Normal behavior. Normal judgment and thought content.   Urinalysis: Urine Protein: Negative Urine Glucose: Negative  Assessment and Plan:  Pregnancy: G3P2002 at 9434w3d  1. Supervision of normal pregnancy, third trimester  2. Reflux - Pepcid  Term labor symptoms and general obstetric precautions including but not limited to vaginal bleeding, contractions, leaking of fluid and fetal  movement were reviewed in detail with the patient. Please refer to After Visit Summary for other counseling recommendations.  Return in about 1 week (around 06/27/2015).   Dorathy KinsmanVirginia Myan Suit, CNM

## 2015-06-20 NOTE — Patient Instructions (Signed)
Contraception Choices Contraception (birth control) is the use of any methods or devices to prevent pregnancy. Below are some methods to help avoid pregnancy. HORMONAL METHODS   Contraceptive implant. This is a thin, plastic tube containing progesterone hormone. It does not contain estrogen hormone. Your health care provider inserts the tube in the inner part of the upper arm. The tube can remain in place for up to 3 years. After 3 years, the implant must be removed. The implant prevents the ovaries from releasing an egg (ovulation), thickens the cervical mucus to prevent sperm from entering the uterus, and thins the lining of the inside of the uterus.  Progesterone-only injections. These injections are given every 3 months by your health care provider to prevent pregnancy. This synthetic progesterone hormone stops the ovaries from releasing eggs. It also thickens cervical mucus and changes the uterine lining. This makes it harder for sperm to survive in the uterus.  Birth control pills. These pills contain estrogen and progesterone hormone. They work by preventing the ovaries from releasing eggs (ovulation). They also cause the cervical mucus to thicken, preventing the sperm from entering the uterus. Birth control pills are prescribed by a health care provider.Birth control pills can also be used to treat heavy periods.  Minipill. This type of birth control pill contains only the progesterone hormone. They are taken every day of each month and must be prescribed by your health care provider.  Birth control patch. The patch contains hormones similar to those in birth control pills. It must be changed once a week and is prescribed by a health care provider.  Vaginal ring. The ring contains hormones similar to those in birth control pills. It is left in the vagina for 3 weeks, removed for 1 week, and then a new one is put back in place. The patient must be comfortable inserting and removing the ring  from the vagina.A health care provider's prescription is necessary.  Emergency contraception. Emergency contraceptives prevent pregnancy after unprotected sexual intercourse. This pill can be taken right after sex or up to 5 days after unprotected sex. It is most effective the sooner you take the pills after having sexual intercourse. Most emergency contraceptive pills are available without a prescription. Check with your pharmacist. Do not use emergency contraception as your only form of birth control. BARRIER METHODS   Female condom. This is a thin sheath (latex or rubber) that is worn over the penis during sexual intercourse. It can be used with spermicide to increase effectiveness.  Female condom. This is a soft, loose-fitting sheath that is put into the vagina before sexual intercourse.  Diaphragm. This is a soft, latex, dome-shaped barrier that must be fitted by a health care provider. It is inserted into the vagina, along with a spermicidal jelly. It is inserted before intercourse. The diaphragm should be left in the vagina for 6 to 8 hours after intercourse.  Cervical cap. This is a round, soft, latex or plastic cup that fits over the cervix and must be fitted by a health care provider. The cap can be left in place for up to 48 hours after intercourse.  Sponge. This is a soft, circular piece of polyurethane foam. The sponge has spermicide in it. It is inserted into the vagina after wetting it and before sexual intercourse.  Spermicides. These are chemicals that kill or block sperm from entering the cervix and uterus. They come in the form of creams, jellies, suppositories, foam, or tablets. They do not require a   prescription. They are inserted into the vagina with an applicator before having sexual intercourse. The process must be repeated every time you have sexual intercourse. INTRAUTERINE CONTRACEPTION  Intrauterine device (IUD). This is a T-shaped device that is put in a woman's uterus  during a menstrual period to prevent pregnancy. There are 2 types:  Copper IUD. This type of IUD is wrapped in copper wire and is placed inside the uterus. Copper makes the uterus and fallopian tubes produce a fluid that kills sperm. It can stay in place for 10 years.  Hormone IUD. This type of IUD contains the hormone progestin (synthetic progesterone). The hormone thickens the cervical mucus and prevents sperm from entering the uterus, and it also thins the uterine lining to prevent implantation of a fertilized egg. The hormone can weaken or kill the sperm that get into the uterus. It can stay in place for 3-5 years, depending on which type of IUD is used. PERMANENT METHODS OF CONTRACEPTION  Female tubal ligation. This is when the woman's fallopian tubes are surgically sealed, tied, or blocked to prevent the egg from traveling to the uterus.  Hysteroscopic sterilization. This involves placing a small coil or insert into each fallopian tube. Your doctor uses a technique called hysteroscopy to do the procedure. The device causes scar tissue to form. This results in permanent blockage of the fallopian tubes, so the sperm cannot fertilize the egg. It takes about 3 months after the procedure for the tubes to become blocked. You must use another form of birth control for these 3 months.  Female sterilization. This is when the female has the tubes that carry sperm tied off (vasectomy).This blocks sperm from entering the vagina during sexual intercourse. After the procedure, the man can still ejaculate fluid (semen). NATURAL PLANNING METHODS  Natural family planning. This is not having sexual intercourse or using a barrier method (condom, diaphragm, cervical cap) on days the woman could become pregnant.  Calendar method. This is keeping track of the length of each menstrual cycle and identifying when you are fertile.  Ovulation method. This is avoiding sexual intercourse during ovulation.  Symptothermal  method. This is avoiding sexual intercourse during ovulation, using a thermometer and ovulation symptoms.  Post-ovulation method. This is timing sexual intercourse after you have ovulated. Regardless of which type or method of contraception you choose, it is important that you use condoms to protect against the transmission of sexually transmitted infections (STIs). Talk with your health care provider about which form of contraception is most appropriate for you.   This information is not intended to replace advice given to you by your health care provider. Make sure you discuss any questions you have with your health care provider.   Document Released: 07/07/2005 Document Revised: 07/12/2013 Document Reviewed: 12/30/2012 Elsevier Interactive Patient Education 2016 [employer] Inc[employer]  

## 2015-06-26 ENCOUNTER — Ambulatory Visit (INDEPENDENT_AMBULATORY_CARE_PROVIDER_SITE_OTHER): Payer: Medicaid Other | Admitting: Student

## 2015-06-26 ENCOUNTER — Encounter (HOSPITAL_COMMUNITY): Payer: Self-pay | Admitting: Obstetrics & Gynecology

## 2015-06-26 ENCOUNTER — Encounter: Payer: Self-pay | Admitting: Student

## 2015-06-26 VITALS — BP 114/75 | HR 99 | Temp 98.7°F | Wt 131.4 lb

## 2015-06-26 DIAGNOSIS — Z3493 Encounter for supervision of normal pregnancy, unspecified, third trimester: Secondary | ICD-10-CM

## 2015-06-26 LAB — POCT URINALYSIS DIP (DEVICE)
BILIRUBIN URINE: NEGATIVE
GLUCOSE, UA: NEGATIVE mg/dL
HGB URINE DIPSTICK: NEGATIVE
Ketones, ur: NEGATIVE mg/dL
LEUKOCYTES UA: NEGATIVE
NITRITE: NEGATIVE
Protein, ur: NEGATIVE mg/dL
Specific Gravity, Urine: 1.01 (ref 1.005–1.030)
UROBILINOGEN UA: 0.2 mg/dL (ref 0.0–1.0)
pH: 7 (ref 5.0–8.0)

## 2015-06-26 NOTE — Progress Notes (Signed)
Breastfeeding tip of the week reviewed. 

## 2015-06-26 NOTE — Patient Instructions (Signed)
Cesarean Delivery Cesarean delivery is the birth of a baby through a cut (incision) in the abdomen and womb (uterus).  LET YOUR HEALTH CARE PROVIDER KNOW ABOUT:  All medicines you are taking, including vitamins, herbs, eye drops, creams, and over-the-counter medicines.  Previous problems you or members of your family have had with the use of anesthetics.  Any bleeding or blood clotting disorders you have.  Family history of blood clots or bleeding disorders.  Any history of deep vein thrombosis (DVT) or pulmonary embolism (PE).  Previous surgeries you have had.  Medical conditions you have.  Any allergies you have.  Complicationsinvolving the pregnancy. RISKS AND COMPLICATIONS  Generally, this is a safe procedure. However, as with any procedure, complications can occur. Possible complications include:  Bleeding.  Infection.  Blood clots.  Injury to surrounding organs.  Problems with anesthesia.  Injury to the baby. BEFORE THE PROCEDURE   You may be given an antacid medicine to drink. This will prevent acid contents in your stomach from going into your lungs if you vomit during the surgery.  You may be given an antibiotic medicine to prevent infection. PROCEDURE   To prevent infection of your incision:  Hair may be removed from your pubic area if it is near your incision.  The skin of your pubic area and lower abdomen will be cleaned with a germ-killing solution (antiseptic).  A tube (Foley catheter) will be placed in your bladder to drain your urine from your bladder into a bag. This keeps your bladder empty during surgery.  An IV tube will be placed in your vein.  You may be given medicine to numb the lower half of your body (regional anesthetic). If you were in labor, you may have already had an epidural in place which can be used in both labor and cesarean delivery. You may possibly be given medicine to make you sleep (general anesthetic) though this is not as  common.  Your heart rate and your baby's heart rate will be monitored.  An incision will be made in your abdomen that extends to your uterus. There are 2 basic kinds of incisions:  The horizontal (transverse) incision. Horizontal incisions are from side to side and are used for most routine cesarean deliveries.  The vertical incision. The vertical incision is from the top of the abdomen to the bottom and is less commonly used. It is often done for women who have a serious complication (extreme prematurity) or under emergency situations.  The horizontal and vertical incisions may both be used at the same time. However, this is very uncommon.  An incision is then made in your uterus to deliver the baby.  Your baby will be delivered.  Your health care provider may place the baby on your chest. It is important to keep the baby warm. Your health care provider will dry off the baby, place the baby directly on your bare skin, and cover the baby with warm, dry blankets.  Both incisions will be closed with absorbable stitches. AFTER THE PROCEDURE   If you were awake during the surgery, you will see your baby right away. If you were asleep, you will see your baby as soon as you are awake.  You may breastfeed your baby after surgery.  You may be able to get up and walk the same day as the surgery. If you need to stay in bed for a period of time, you will receive help to turn, cough, and take deep breaths after   surgery. This helps prevent lung problems such as pneumonia.  Do not get out of bed alone the first time after surgery. You will need help getting out of bed until you are able to do this by yourself.  You may be able to shower the day after your cesarean delivery. After the bandage (dressing) is taken off the incision site, a nurse will assist you to shower if you would like help.  You may be directed to take actions to help prevent blood clots in your legs. These may  include:  Walking shortly after surgery, with someone assisting you. Moving around after surgery helps to improve blood flow.  Wearing compression stockings or using different types of devices.  Taking medicines to thin your blood (anticoagulants) if you are at high risk for DVT or PE.  Save any blood clots that you pass from your vagina. If you pass a clot while on the toilet, do not flush it. Call for the nurse. Tell the nurse if you think you are bleeding too much or passing too many clots.  You will be given medicine for pain and nausea as needed. Let your health care providers know if you are hurting. You may also be given an antibiotic to prevent an infection.  Your IV tube will be taken out when you are drinking a reasonable amount of fluids. The Foley catheter is taken out when you are up and walking.  If your blood type is Rh negative and your baby's blood type is Rh positive, you will be given a shot of anti-D immune globulin. This shot prevents you from having Rh problems with a future pregnancy. You should get the shot even if you had your tubes tied (tubal ligation).  If you are allowed to take the baby for a walk, place the baby in the bassinet and push it.   This information is not intended to replace advice given to you by your health care provider. Make sure you discuss any questions you have with your health care provider.   Document Released: 07/07/2005 Document Revised: 03/28/2015 Document Reviewed: 03/03/2012 Elsevier Interactive Patient Education 2016 [employer] Inc[employer] Fetal Movement Counts Patient Name: __________________________________________________ Patient Due Date: ____________________ Performing a fetal movement count is highly recommended in high-risk pregnancies, but it is good for every pregnant woman to do. Your health care provider may ask you to start counting fetal movements at 28 weeks of the pregnancy. Fetal movements often increase:  After eating a full  meal.  After physical activity.  After eating or drinking something sweet or cold.  At rest. Pay attention to when you feel the baby is most active. This will help you notice a pattern of your baby's sleep and wake cycles and what factors contribute to an increase in fetal movement. It is important to perform a fetal movement count at the same time each day when your baby is normally most active.  HOW TO COUNT FETAL MOVEMENTS  Find a quiet and comfortable area to sit or lie down on your left side. Lying on your left side provides the best blood and oxygen circulation to your baby.  Write down the day and time on a sheet of paper or in a journal.  Start counting kicks, flutters, swishes, rolls, or jabs in a 2-hour period. You should feel at least 10 movements within 2 hours.  If you do not feel 10 movements in 2 hours, wait 2-3 hours and count again. Look for a change in the  pattern or not enough counts in 2 hours. SEEK MEDICAL CARE IF:  You feel less than 10 counts in 2 hours, tried twice.  There is no movement in over an hour.  The pattern is changing or taking longer each day to reach 10 counts in 2 hours.  You feel the baby is not moving as he or she usually does. Date: ____________ Movements: ____________ Start time: ____________ Doreatha Martin time: ____________  Date: ____________ Movements: ____________ Start time: ____________ Doreatha Martin time: ____________ Date: ____________ Movements: ____________ Start time: ____________ Doreatha Martin time: ____________ Date: ____________ Movements: ____________ Start time: ____________ Doreatha Martin time: ____________ Date: ____________ Movements: ____________ Start time: ____________ Doreatha Martin time: ____________ Date: ____________ Movements: ____________ Start time: ____________ Doreatha Martin time: ____________ Date: ____________ Movements: ____________ Start time: ____________ Doreatha Martin time: ____________ Date: ____________ Movements: ____________ Start time: ____________  Doreatha Martin time: ____________  Date: ____________ Movements: ____________ Start time: ____________ Doreatha Martin time: ____________ Date: ____________ Movements: ____________ Start time: ____________ Doreatha Martin time: ____________ Date: ____________ Movements: ____________ Start time: ____________ Doreatha Martin time: ____________ Date: ____________ Movements: ____________ Start time: ____________ Doreatha Martin time: ____________ Date: ____________ Movements: ____________ Start time: ____________ Doreatha Martin time: ____________ Date: ____________ Movements: ____________ Start time: ____________ Doreatha Martin time: ____________ Date: ____________ Movements: ____________ Start time: ____________ Doreatha Martin time: ____________  Date: ____________ Movements: ____________ Start time: ____________ Doreatha Martin time: ____________ Date: ____________ Movements: ____________ Start time: ____________ Doreatha Martin time: ____________ Date: ____________ Movements: ____________ Start time: ____________ Doreatha Martin time: ____________ Date: ____________ Movements: ____________ Start time: ____________ Doreatha Martin time: ____________ Date: ____________ Movements: ____________ Start time: ____________ Doreatha Martin time: ____________ Date: ____________ Movements: ____________ Start time: ____________ Doreatha Martin time: ____________ Date: ____________ Movements: ____________ Start time: ____________ Doreatha Martin time: ____________  Date: ____________ Movements: ____________ Start time: ____________ Doreatha Martin time: ____________ Date: ____________ Movements: ____________ Start time: ____________ Doreatha Martin time: ____________ Date: ____________ Movements: ____________ Start time: ____________ Doreatha Martin time: ____________ Date: ____________ Movements: ____________ Start time: ____________ Doreatha Martin time: ____________ Date: ____________ Movements: ____________ Start time: ____________ Doreatha Martin time: ____________ Date: ____________ Movements: ____________ Start time: ____________ Doreatha Martin time: ____________ Date: ____________  Movements: ____________ Start time: ____________ Doreatha Martin time: ____________  Date: ____________ Movements: ____________ Start time: ____________ Doreatha Martin time: ____________ Date: ____________ Movements: ____________ Start time: ____________ Doreatha Martin time: ____________ Date: ____________ Movements: ____________ Start time: ____________ Doreatha Martin time: ____________ Date: ____________ Movements: ____________ Start time: ____________ Doreatha Martin time: ____________ Date: ____________ Movements: ____________ Start time: ____________ Doreatha Martin time: ____________ Date: ____________ Movements: ____________ Start time: ____________ Doreatha Martin time: ____________ Date: ____________ Movements: ____________ Start time: ____________ Doreatha Martin time: ____________  Date: ____________ Movements: ____________ Start time: ____________ Doreatha Martin time: ____________ Date: ____________ Movements: ____________ Start time: ____________ Doreatha Martin time: ____________ Date: ____________ Movements: ____________ Start time: ____________ Doreatha Martin time: ____________ Date: ____________ Movements: ____________ Start time: ____________ Doreatha Martin time: ____________ Date: ____________ Movements: ____________ Start time: ____________ Doreatha Martin time: ____________ Date: ____________ Movements: ____________ Start time: ____________ Doreatha Martin time: ____________ Date: ____________ Movements: ____________ Start time: ____________ Doreatha Martin time: ____________  Date: ____________ Movements: ____________ Start time: ____________ Doreatha Martin time: ____________ Date: ____________ Movements: ____________ Start time: ____________ Doreatha Martin time: ____________ Date: ____________ Movements: ____________ Start time: ____________ Doreatha Martin time: ____________ Date: ____________ Movements: ____________ Start time: ____________ Doreatha Martin time: ____________ Date: ____________ Movements: ____________ Start time: ____________ Doreatha Martin time: ____________ Date: ____________ Movements: ____________ Start time:  ____________ Doreatha Martin time: ____________ Date: ____________ Movements: ____________ Start time: ____________ Doreatha Martin time: ____________  Date: ____________ Movements: ____________ Start time: ____________ Doreatha Martin time: ____________ Date: ____________ Movements: ____________ Start time: ____________ Doreatha Martin time: ____________ Date: ____________ Movements: ____________ Start  time: ____________ Doreatha MartinFinish time: ____________ Date: ____________ Movements: ____________ Start time: ____________ Doreatha MartinFinish time: ____________ Date: ____________ Movements: ____________ Start time: ____________ Doreatha MartinFinish time: ____________ Date: ____________ Movements: ____________ Start time: ____________ Doreatha MartinFinish time: ____________   This information is not intended to replace advice given to you by your health care provider. Make sure you discuss any questions you have with your health care provider.   Document Released: 08/06/2006 Document Revised: 07/28/2014 Document Reviewed: 05/03/2012 Elsevier Interactive Patient Education 2016 [employer] Inc[employer] CorporationBraxton Hicks Contractions Contractions of the uterus can occur throughout pregnancy. Contractions are not always a sign that you are in labor.  WHAT ARE BRAXTON HICKS CONTRACTIONS?  Contractions that occur before labor are called Braxton Hicks contractions, or false labor. Toward the end of pregnancy (32-34 weeks), these contractions can develop more often and may become more forceful. This is not true labor because these contractions do not result in opening (dilatation) and thinning of the cervix. They are sometimes difficult to tell apart from true labor because these contractions can be forceful and people have different pain tolerances. You should not feel embarrassed if you go to the hospital with false labor. Sometimes, the only way to tell if you are in true labor is for your health care provider to look for changes in the cervix. If there are no prenatal problems or other health problems  associated with the pregnancy, it is completely safe to be sent home with false labor and await the onset of true labor. HOW CAN YOU TELL THE DIFFERENCE BETWEEN TRUE AND FALSE LABOR? False Labor  The contractions of false labor are usually shorter and not as hard as those of true labor.   The contractions are usually irregular.   The contractions are often felt in the front of the lower abdomen and in the groin.   The contractions may go away when you walk around or change positions while lying down.   The contractions get weaker and are shorter lasting as time goes on.   The contractions do not usually become progressively stronger, regular, and closer together as with true labor.  True Labor  Contractions in true labor last 30-70 seconds, become very regular, usually become more intense, and increase in frequency.   The contractions do not go away with walking.   The discomfort is usually felt in the top of the uterus and spreads to the lower abdomen and low back.   True labor can be determined by your health care provider with an exam. This will show that the cervix is dilating and getting thinner.  WHAT TO REMEMBER  Keep up with your usual exercises and follow other instructions given by your health care provider.   Take medicines as directed by your health care provider.   Keep your regular prenatal appointments.   Eat and drink lightly if you think you are going into labor.   If Braxton Hicks contractions are making you uncomfortable:   Change your position from lying down or resting to walking, or from walking to resting.   Sit and rest in a tub of warm water.   Drink 2-3 glasses of water. Dehydration may cause these contractions.   Do slow and deep breathing several times an hour.  WHEN SHOULD I SEEK IMMEDIATE MEDICAL CARE? Seek immediate medical care if:  Your contractions become stronger, more regular, and closer together.   You have fluid  leaking or gushing from your vagina.   You have a fever.   You  pass blood-tinged mucus.   You have vaginal bleeding.   You have continuous abdominal pain.   You have low back pain that you never had before.   You feel your baby's head pushing down and causing pelvic pressure.   Your baby is not moving as much as it used to.    This information is not intended to replace advice given to you by your health care provider. Make sure you discuss any questions you have with your health care provider.   Document Released: 07/07/2005 Document Revised: 07/12/2013 Document Reviewed: 04/18/2013 Elsevier Interactive Patient Education [employer]

## 2015-06-26 NOTE — Progress Notes (Signed)
Subjective:  Sara Padilla is a 24 y.o. G3P2002 at 4028w2d being seen today for ongoing prenatal care.  She is currently monitored for the following issues for this low-risk pregnancy and has Supervision of normal pregnancy; Thyroid disorder; Previous cesarean delivery, antepartum condition or complication; Proteinuria affecting pregnancy in third trimester; and Uterine size-date discrepancy in third trimester, antepartum on her problem list.  Patient reports no complaints.  Contractions: Irregular. Vag. Bleeding: None.  Movement: Present. Denies leaking of fluid.   The following portions of the patient's history were reviewed and updated as appropriate: allergies, current medications, past family history, past medical history, past social history, past surgical history and problem list. Problem list updated.  Objective:   Filed Vitals:   06/26/15 1006  BP: 114/75  Pulse: 99  Temp: 98.7 F (37.1 C)  Weight: 131 lb 6.4 oz (59.603 kg)    Fetal Status: Fetal Heart Rate (bpm): 130 Fundal Height: 37 cm Movement: Present     General:  Alert, oriented and cooperative. Patient is in no acute distress.  Skin: Skin is warm and dry. No rash noted.   Cardiovascular: Normal heart rate noted  Respiratory: Normal respiratory effort, no problems with respiration noted  Abdomen: Soft, gravid, appropriate for gestational age. Pain/Pressure: Present     Pelvic: Vag. Bleeding: None Vag D/C Character: White   Cervical exam deferred        Extremities: Normal range of motion.  Edema: None  Mental Status: Normal mood and affect. Normal behavior. Normal judgment and thought content.   Urinalysis: Urine Protein: Negative Urine Glucose: Negative  Assessment and Plan:  Pregnancy: G3P2002 at 2328w2d  1. Supervision of normal pregnancy, third trimester   Term labor symptoms and general obstetric precautions including but not limited to vaginal bleeding, contractions, leaking of fluid and fetal movement were  reviewed in detail with the patient. Please refer to After Visit Summary for other counseling recommendations.  Pre op appointment on 12/9.  C/section scheduled 07/01/15.   Judeth HornErin Jontae Adebayo, NP

## 2015-06-29 ENCOUNTER — Encounter (HOSPITAL_COMMUNITY): Payer: Self-pay

## 2015-06-29 ENCOUNTER — Encounter (HOSPITAL_COMMUNITY)
Admission: RE | Admit: 2015-06-29 | Discharge: 2015-06-29 | Disposition: A | Discharge status: Home / Self Care | Payer: Medicaid Other | Source: Ambulatory Visit | Attending: Obstetrics & Gynecology | Admitting: Obstetrics & Gynecology

## 2015-06-29 VITALS — BP 108/76 | HR 97 | Resp 20 | Ht 63.0 in | Wt 131.0 lb

## 2015-06-29 DIAGNOSIS — O26843 Uterine size-date discrepancy, third trimester: Secondary | ICD-10-CM

## 2015-06-29 DIAGNOSIS — O34219 Maternal care for unspecified type scar from previous cesarean delivery: Secondary | ICD-10-CM

## 2015-06-29 DIAGNOSIS — Z01818 Encounter for other preprocedural examination: Secondary | ICD-10-CM | POA: Diagnosis present

## 2015-06-29 DIAGNOSIS — O1213 Gestational proteinuria, third trimester: Secondary | ICD-10-CM

## 2015-06-29 LAB — CBC
HCT: 31.8 % — ABNORMAL LOW (ref 36.0–46.0)
Hemoglobin: 10.4 g/dL — ABNORMAL LOW (ref 12.0–15.0)
MCH: 29.5 pg (ref 26.0–34.0)
MCHC: 32.7 g/dL (ref 30.0–36.0)
MCV: 90.1 fL (ref 78.0–100.0)
PLATELETS: 468 10*3/uL — AB (ref 150–400)
RBC: 3.53 MIL/uL — AB (ref 3.87–5.11)
RDW: 13.1 % (ref 11.5–15.5)
WBC: 12.5 10*3/uL — AB (ref 4.0–10.5)

## 2015-06-29 LAB — TYPE AND SCREEN
ABO/RH(D): O POS
ANTIBODY SCREEN: NEGATIVE

## 2015-06-29 LAB — ABO/RH: ABO/RH(D): O POS

## 2015-06-29 NOTE — Patient Instructions (Addendum)
   Your procedure is scheduled on: December 11 [facility](SUNDAY)  Enter through through MATERNITY ADMISSIONS AT 730AM  Pick up the phone at the desk and dial [phone] and inform us of your arrival.  Please call this number if you have any problems the morning of surgery: [phone]  Remember:  DO NOT EAT OR DRINK AFTER MIDNIGHT December 10 (SATURDAY)    Do not wear jewelry, make-up, or FINGER nail polish No metal in your hair or on your body. Do not wear lotions, powders, perfumes.  You may wear deodorant.  Do not bring valuables to the hospital. Contacts, dentures or bridgework may not be worn into surgery.  Leave suitcase in the car. After Surgery it may be brought to your room. For patients being admitted to the hospital, checkout time is 11:00am the day of discharge.

## 2015-06-30 LAB — RPR: RPR: NONREACTIVE

## 2015-06-30 MED ORDER — DEXTROSE 5 % IV SOLN
2.0000 g | INTRAVENOUS | Status: DC
  Filled 2015-06-30: qty 2

## 2015-07-01 ENCOUNTER — Inpatient Hospital Stay (HOSPITAL_COMMUNITY)
Admission: AD | Admit: 2015-07-01 | Discharge: 2015-07-03 | DRG: 766 | Disposition: A | Discharge status: Home / Self Care | Payer: Medicaid Other | Source: Ambulatory Visit | Attending: Obstetrics & Gynecology | Admitting: Obstetrics & Gynecology

## 2015-07-01 ENCOUNTER — Encounter (HOSPITAL_COMMUNITY)
Admission: AD | Disposition: A | Discharge status: Home / Self Care | Payer: Self-pay | Source: Ambulatory Visit | Attending: Obstetrics & Gynecology

## 2015-07-01 ENCOUNTER — Inpatient Hospital Stay (HOSPITAL_COMMUNITY)
Admission: RE | Admit: 2015-07-01 | Payer: Medicaid Other | Source: Ambulatory Visit | Admitting: Obstetrics & Gynecology

## 2015-07-01 ENCOUNTER — Inpatient Hospital Stay (HOSPITAL_COMMUNITY): Payer: Medicaid Other | Admitting: Anesthesiology

## 2015-07-01 ENCOUNTER — Encounter (HOSPITAL_COMMUNITY): Payer: Self-pay | Admitting: *Deleted

## 2015-07-01 DIAGNOSIS — O34219 Maternal care for unspecified type scar from previous cesarean delivery: Secondary | ICD-10-CM | POA: Diagnosis present

## 2015-07-01 DIAGNOSIS — Z87891 Personal history of nicotine dependence: Secondary | ICD-10-CM

## 2015-07-01 DIAGNOSIS — Z809 Family history of malignant neoplasm, unspecified: Secondary | ICD-10-CM | POA: Diagnosis not present

## 2015-07-01 DIAGNOSIS — O26843 Uterine size-date discrepancy, third trimester: Secondary | ICD-10-CM

## 2015-07-01 DIAGNOSIS — Z3A39 39 weeks gestation of pregnancy: Secondary | ICD-10-CM

## 2015-07-01 DIAGNOSIS — O1213 Gestational proteinuria, third trimester: Secondary | ICD-10-CM

## 2015-07-01 DIAGNOSIS — O34211 Maternal care for low transverse scar from previous cesarean delivery: Secondary | ICD-10-CM | POA: Diagnosis present

## 2015-07-01 DIAGNOSIS — Z98891 History of uterine scar from previous surgery: Secondary | ICD-10-CM

## 2015-07-01 SURGERY — Surgical Case
Anesthesia: Spinal | Site: Abdomen

## 2015-07-01 MED ORDER — TETANUS-DIPHTH-ACELL PERTUSSIS 5-2.5-18.5 LF-MCG/0.5 IM SUSP
0.5000 mL | Freq: Once | INTRAMUSCULAR | Status: DC
  Filled 2015-07-01: qty 0.5

## 2015-07-01 MED ORDER — PRENATAL MULTIVITAMIN CH
1.0000 | ORAL_TABLET | Freq: Every day | ORAL | Status: DC
  Administered 2015-07-02 – 2015-07-03 (×2): 1 via ORAL
  Filled 2015-07-01 (×4): qty 1

## 2015-07-01 MED ORDER — DEXAMETHASONE SODIUM PHOSPHATE 4 MG/ML IJ SOLN
INTRAMUSCULAR | Status: DC | PRN
  Administered 2015-07-01: 4 mg via INTRAVENOUS

## 2015-07-01 MED ORDER — DIPHENHYDRAMINE HCL 50 MG/ML IJ SOLN: 12.5000 mg | INTRAMUSCULAR | Status: DC | PRN

## 2015-07-01 MED ORDER — PHENYLEPHRINE 8 MG IN D5W 100 ML (0.08MG/ML) PREMIX OPTIME
INJECTION | INTRAVENOUS | Status: DC | PRN
  Administered 2015-07-01: 60 ug/min via INTRAVENOUS

## 2015-07-01 MED ORDER — FENTANYL CITRATE (PF) 100 MCG/2ML IJ SOLN
INTRAMUSCULAR | Status: DC | PRN
  Administered 2015-07-01: 12.5 ug via INTRATHECAL

## 2015-07-01 MED ORDER — KETOROLAC TROMETHAMINE 30 MG/ML IJ SOLN
30.0000 mg | Freq: Four times a day (QID) | INTRAMUSCULAR | Status: DC | PRN
  Administered 2015-07-01 (×2): 30 mg via INTRAVENOUS
  Filled 2015-07-01 (×2): qty 1

## 2015-07-01 MED ORDER — MENTHOL 3 MG MT LOZG
1.0000 | LOZENGE | OROMUCOSAL | Status: DC | PRN
  Administered 2015-07-02: 3 mg via ORAL
  Filled 2015-07-01 (×2): qty 9

## 2015-07-01 MED ORDER — DEXTROSE 5 % IV SOLN
2.0000 g | Freq: Once | INTRAVENOUS | Status: AC
  Administered 2015-07-01: 2 g via INTRAVENOUS
  Filled 2015-07-01: qty 2

## 2015-07-01 MED ORDER — NALBUPHINE HCL 10 MG/ML IJ SOLN: 5.0000 mg | INTRAMUSCULAR | Status: DC | PRN

## 2015-07-01 MED ORDER — SIMETHICONE 80 MG PO CHEW
80.0000 mg | CHEWABLE_TABLET | ORAL | Status: DC | PRN
  Administered 2015-07-02 (×2): 80 mg via ORAL
  Filled 2015-07-01 (×3): qty 1

## 2015-07-01 MED ORDER — OXYCODONE-ACETAMINOPHEN 5-325 MG PO TABS
2.0000 | ORAL_TABLET | ORAL | Status: DC | PRN
  Administered 2015-07-02 – 2015-07-03 (×6): 2 via ORAL
  Filled 2015-07-01 (×6): qty 2

## 2015-07-01 MED ORDER — GLYCOPYRROLATE 0.2 MG/ML IJ SOLN
INTRAMUSCULAR | Status: DC | PRN
  Administered 2015-07-01: 0.2 mg via INTRAVENOUS

## 2015-07-01 MED ORDER — MEASLES, MUMPS & RUBELLA VAC ~~LOC~~ INJ
0.5000 mL | INJECTION | Freq: Once | SUBCUTANEOUS | Status: DC
  Filled 2015-07-01: qty 0.5

## 2015-07-01 MED ORDER — EPHEDRINE 5 MG/ML INJ: 10.0000 mg | Freq: Once | INTRAVENOUS | Status: DC

## 2015-07-01 MED ORDER — DIPHENHYDRAMINE HCL 25 MG PO CAPS
25.0000 mg | ORAL_CAPSULE | ORAL | Status: DC | PRN
  Filled 2015-07-01: qty 1

## 2015-07-01 MED ORDER — ONDANSETRON HCL 4 MG/2ML IJ SOLN
INTRAMUSCULAR | Status: DC | PRN
  Administered 2015-07-01: 4 mg via INTRAVENOUS

## 2015-07-01 MED ORDER — EPHEDRINE 5 MG/ML INJ
5.0000 mg | Freq: Once | INTRAVENOUS | Status: AC
  Administered 2015-07-01: 5 mg via INTRAVENOUS

## 2015-07-01 MED ORDER — MORPHINE SULFATE (PF) 0.5 MG/ML IJ SOLN
INTRAMUSCULAR | Status: DC | PRN
  Administered 2015-07-01: .1 mg via INTRATHECAL

## 2015-07-01 MED ORDER — BUPIVACAINE HCL (PF) 0.5 % IJ SOLN
INTRAMUSCULAR | Status: AC
  Filled 2015-07-01: qty 30

## 2015-07-01 MED ORDER — LANOLIN HYDROUS EX OINT
1.0000 | TOPICAL_OINTMENT | CUTANEOUS | Status: DC | PRN
Start: 2015-07-01 — End: 2015-07-03

## 2015-07-01 MED ORDER — LACTATED RINGERS IV SOLN
INTRAVENOUS | Status: DC
  Administered 2015-07-01 (×2): via INTRAVENOUS

## 2015-07-01 MED ORDER — METHYLERGONOVINE MALEATE 0.2 MG/ML IJ SOLN
INTRAMUSCULAR | Status: DC | PRN
  Administered 2015-07-01: .2 mg via INTRAMUSCULAR

## 2015-07-01 MED ORDER — ZOLPIDEM TARTRATE 5 MG PO TABS: 5.0000 mg | ORAL_TABLET | Freq: Every evening | ORAL | Status: DC | PRN

## 2015-07-01 MED ORDER — KETOROLAC TROMETHAMINE 30 MG/ML IJ SOLN
INTRAMUSCULAR | Status: AC
  Administered 2015-07-01: 30 mg via INTRAMUSCULAR
  Filled 2015-07-01: qty 1

## 2015-07-01 MED ORDER — NALBUPHINE HCL 10 MG/ML IJ SOLN: 5.0000 mg | Freq: Once | INTRAMUSCULAR | Status: DC | PRN

## 2015-07-01 MED ORDER — NALOXONE HCL 2 MG/2ML IJ SOSY
1.0000 ug/kg/h | PREFILLED_SYRINGE | INTRAVENOUS | Status: DC | PRN
  Filled 2015-07-01: qty 2

## 2015-07-01 MED ORDER — ONDANSETRON HCL 4 MG/2ML IJ SOLN: 4.0000 mg | Freq: Three times a day (TID) | INTRAMUSCULAR | Status: DC | PRN

## 2015-07-01 MED ORDER — EPHEDRINE 5 MG/ML INJ
INTRAVENOUS | Status: AC
Start: 2015-07-01 — End: 2015-07-01
  Filled 2015-07-01: qty 4

## 2015-07-01 MED ORDER — FERROUS SULFATE 325 (65 FE) MG PO TABS
325.0000 mg | ORAL_TABLET | Freq: Two times a day (BID) | ORAL | Status: DC
  Administered 2015-07-01 – 2015-07-03 (×4): 325 mg via ORAL
  Filled 2015-07-01 (×7): qty 1

## 2015-07-01 MED ORDER — NALOXONE HCL 0.4 MG/ML IJ SOLN: 0.4000 mg | INTRAMUSCULAR | Status: DC | PRN

## 2015-07-01 MED ORDER — LACTATED RINGERS IV SOLN: INTRAVENOUS | Status: DC

## 2015-07-01 MED ORDER — IBUPROFEN 600 MG PO TABS
600.0000 mg | ORAL_TABLET | Freq: Four times a day (QID) | ORAL | Status: DC
  Administered 2015-07-02 – 2015-07-03 (×6): 600 mg via ORAL
  Filled 2015-07-01 (×6): qty 1

## 2015-07-01 MED ORDER — LACTATED RINGERS IV BOLUS (SEPSIS)
500.0000 mL | Freq: Once | INTRAVENOUS | Status: AC
  Administered 2015-07-01: 500 mL via INTRAVENOUS

## 2015-07-01 MED ORDER — KETOROLAC TROMETHAMINE 30 MG/ML IJ SOLN
30.0000 mg | Freq: Four times a day (QID) | INTRAMUSCULAR | Status: DC | PRN
  Administered 2015-07-01: 30 mg via INTRAMUSCULAR

## 2015-07-01 MED ORDER — SCOPOLAMINE 1 MG/3DAYS TD PT72
MEDICATED_PATCH | TRANSDERMAL | Status: DC | PRN
  Administered 2015-07-01: 1 via TRANSDERMAL

## 2015-07-01 MED ORDER — LACTATED RINGERS IV SOLN
INTRAVENOUS | Status: DC | PRN
  Administered 2015-07-01: 09:00:00 via INTRAVENOUS

## 2015-07-01 MED ORDER — ERYTHROMYCIN 5 MG/GM OP OINT
TOPICAL_OINTMENT | OPHTHALMIC | Status: AC
  Filled 2015-07-01: qty 1

## 2015-07-01 MED ORDER — SENNOSIDES-DOCUSATE SODIUM 8.6-50 MG PO TABS
2.0000 | ORAL_TABLET | ORAL | Status: DC
  Administered 2015-07-01 – 2015-07-02 (×2): 2 via ORAL
  Filled 2015-07-01 (×4): qty 2

## 2015-07-01 MED ORDER — KETOROLAC TROMETHAMINE 30 MG/ML IJ SOLN: 30.0000 mg | Freq: Four times a day (QID) | INTRAMUSCULAR | Status: DC | PRN

## 2015-07-01 MED ORDER — WITCH HAZEL-GLYCERIN EX PADS
1.0000 | MEDICATED_PAD | CUTANEOUS | Status: DC | PRN
Start: 2015-07-01 — End: 2015-07-03

## 2015-07-01 MED ORDER — OXYTOCIN 10 UNIT/ML IJ SOLN
40.0000 [IU] | INTRAVENOUS | Status: DC | PRN
  Administered 2015-07-01: 40 [IU] via INTRAVENOUS

## 2015-07-01 MED ORDER — MEPERIDINE HCL 25 MG/ML IJ SOLN: 6.2500 mg | INTRAMUSCULAR | Status: DC | PRN

## 2015-07-01 MED ORDER — SIMETHICONE 80 MG PO CHEW
80.0000 mg | CHEWABLE_TABLET | ORAL | Status: DC
  Administered 2015-07-01 – 2015-07-02 (×2): 80 mg via ORAL
  Filled 2015-07-01 (×4): qty 1

## 2015-07-01 MED ORDER — FENTANYL CITRATE (PF) 100 MCG/2ML IJ SOLN: 25.0000 ug | INTRAMUSCULAR | Status: DC | PRN

## 2015-07-01 MED ORDER — OXYTOCIN 40 UNITS IN LACTATED RINGERS INFUSION - SIMPLE MED: 62.5000 mL/h | INTRAVENOUS | Status: AC

## 2015-07-01 MED ORDER — DIPHENHYDRAMINE HCL 25 MG PO CAPS
25.0000 mg | ORAL_CAPSULE | Freq: Four times a day (QID) | ORAL | Status: DC | PRN
  Filled 2015-07-01: qty 1

## 2015-07-01 MED ORDER — BUPIVACAINE HCL (PF) 0.5 % IJ SOLN
INTRAMUSCULAR | Status: DC | PRN
  Administered 2015-07-01: 30 mL

## 2015-07-01 MED ORDER — LACTATED RINGERS IV SOLN
INTRAVENOUS | Status: DC
  Administered 2015-07-01 (×3): via INTRAVENOUS

## 2015-07-01 MED ORDER — BUPIVACAINE IN DEXTROSE 0.75-8.25 % IT SOLN
INTRATHECAL | Status: DC | PRN
  Administered 2015-07-01: 1.4 mL via INTRATHECAL

## 2015-07-01 MED ORDER — ACETAMINOPHEN 325 MG PO TABS
650.0000 mg | ORAL_TABLET | ORAL | Status: DC | PRN
  Administered 2015-07-01: 650 mg via ORAL
  Filled 2015-07-01: qty 2

## 2015-07-01 MED ORDER — SODIUM CHLORIDE 0.9 % IJ SOLN: 3.0000 mL | INTRAMUSCULAR | Status: DC | PRN

## 2015-07-01 MED ORDER — CITRIC ACID-SODIUM CITRATE 334-500 MG/5ML PO SOLN
30.0000 mL | ORAL | Status: AC
  Administered 2015-07-01: 30 mL via ORAL
  Filled 2015-07-01: qty 15

## 2015-07-01 MED ORDER — MAGNESIUM HYDROXIDE 400 MG/5ML PO SUSP
30.0000 mL | ORAL | Status: DC | PRN
  Filled 2015-07-01: qty 30

## 2015-07-01 MED ORDER — SCOPOLAMINE 1 MG/3DAYS TD PT72: 1.0000 | MEDICATED_PATCH | Freq: Once | TRANSDERMAL | Status: DC

## 2015-07-01 MED ORDER — DIBUCAINE 1 % RE OINT
1.0000 "application " | TOPICAL_OINTMENT | RECTAL | Status: DC | PRN
  Filled 2015-07-01: qty 28

## 2015-07-01 MED ORDER — OXYCODONE-ACETAMINOPHEN 5-325 MG PO TABS
1.0000 | ORAL_TABLET | ORAL | Status: DC | PRN
  Administered 2015-07-01 – 2015-07-02 (×3): 1 via ORAL
  Filled 2015-07-01 (×3): qty 1

## 2015-07-01 SURGICAL SUPPLY — 36 items
BENZOIN TINCTURE PRP APPL 2/3 (GAUZE/BANDAGES/DRESSINGS) ×3 IMPLANT
CLAMP CORD UMBIL (MISCELLANEOUS) IMPLANT
CLOSURE STERI-STRIP 1/4X4 (GAUZE/BANDAGES/DRESSINGS) ×3 IMPLANT
CLOTH BEACON ORANGE TIMEOUT ST (SAFETY) ×3 IMPLANT
DRAPE SHEET LG 3/4 BI-LAMINATE (DRAPES) IMPLANT
DRSG OPSITE POSTOP 4X10 (GAUZE/BANDAGES/DRESSINGS) ×3 IMPLANT
DURAPREP 26ML APPLICATOR (WOUND CARE) ×3 IMPLANT
ELECT REM PT RETURN 9FT ADLT (ELECTROSURGICAL) ×3
ELECTRODE REM PT RTRN 9FT ADLT (ELECTROSURGICAL) ×1 IMPLANT
EXTRACTOR VACUUM M CUP 4 TUBE (SUCTIONS) IMPLANT
EXTRACTOR VACUUM M CUP 4' TUBE (SUCTIONS)
GLOVE BIOGEL PI IND STRL 7.0 (GLOVE) ×3 IMPLANT
GLOVE BIOGEL PI INDICATOR 7.0 (GLOVE) ×6
GLOVE ECLIPSE 7.0 STRL STRAW (GLOVE) ×3 IMPLANT
GOWN STRL REUS W/TWL LRG LVL3 (GOWN DISPOSABLE) ×6 IMPLANT
HEMOSTAT SURGICEL 2X3 (HEMOSTASIS) ×3 IMPLANT
KIT ABG SYR 3ML LUER SLIP (SYRINGE) IMPLANT
NEEDLE HYPO 22GX1.5 SAFETY (NEEDLE) ×3 IMPLANT
NEEDLE HYPO 25X5/8 SAFETYGLIDE (NEEDLE) ×3 IMPLANT
NS IRRIG 1000ML POUR BTL (IV SOLUTION) ×3 IMPLANT
PACK C SECTION WH (CUSTOM PROCEDURE TRAY) ×3 IMPLANT
PAD ABD 7.5X8 STRL (GAUZE/BANDAGES/DRESSINGS) ×3 IMPLANT
PAD OB MATERNITY 4.3X12.25 (PERSONAL CARE ITEMS) ×3 IMPLANT
PENCIL SMOKE EVAC W/HOLSTER (ELECTROSURGICAL) ×3 IMPLANT
RTRCTR C-SECT PINK 25CM LRG (MISCELLANEOUS) IMPLANT
SUT PDS AB 0 CT1 27 (SUTURE) IMPLANT
SUT PDS AB 0 CTX 36 PDP370T (SUTURE) ×3 IMPLANT
SUT PLAIN 2 0 XLH (SUTURE) IMPLANT
SUT VIC AB 0 CTX 36 (SUTURE) ×6
SUT VIC AB 0 CTX36XBRD ANBCTRL (SUTURE) ×3 IMPLANT
SUT VIC AB 2-0 CT1 27 (SUTURE) ×2
SUT VIC AB 2-0 CT1 TAPERPNT 27 (SUTURE) ×1 IMPLANT
SUT VIC AB 4-0 KS 27 (SUTURE) ×3 IMPLANT
SYR CONTROL 10ML LL (SYRINGE) ×3 IMPLANT
TOWEL OR 17X24 6PK STRL BLUE (TOWEL DISPOSABLE) ×3 IMPLANT
TRAY FOLEY CATH SILVER 14FR (SET/KITS/TRAYS/PACK) ×3 IMPLANT

## 2015-07-01 NOTE — Anesthesia Preprocedure Evaluation (Addendum)
Anesthesia Evaluation  Patient identified by MRN, date of birth, ID band Patient awake    Reviewed: Allergy & Precautions, NPO status , Patient's Chart, lab work & pertinent test results  Airway Mallampati: II  TM Distance: >3 FB Neck ROM: Full    Dental no notable dental hx. (+) Poor Dentition   Pulmonary former smoker,    Pulmonary exam normal breath sounds clear to auscultation       Cardiovascular negative cardio ROS Normal cardiovascular exam Rhythm:Regular Rate:Normal     Neuro/Psych negative neurological ROS  negative psych ROS   GI/Hepatic negative GI ROS, Neg liver ROS,   Endo/Other  negative endocrine ROS  Renal/GU negative Renal ROS  negative genitourinary   Musculoskeletal negative musculoskeletal ROS (+)   Abdominal   Peds negative pediatric ROS (+)  Hematology negative hematology ROS (+)   Anesthesia Other Findings   Reproductive/Obstetrics (+) Pregnancy                            Anesthesia Physical Anesthesia Plan  ASA: II  Anesthesia Plan: Spinal   Post-op Pain Management:    Induction:   Airway Management Planned: Natural Airway  Additional Equipment:   Intra-op Plan:   Post-operative Plan:   Informed Consent: I have reviewed the patients History and Physical, chart, labs and discussed the procedure including the risks, benefits and alternatives for the proposed anesthesia with the patient or authorized representative who has indicated his/her understanding and acceptance.   Dental advisory given  Plan Discussed with: CRNA  Anesthesia Plan Comments:         Anesthesia Quick Evaluation

## 2015-07-01 NOTE — Anesthesia Procedure Notes (Signed)
Spinal Patient location during procedure: OR Staffing Anesthesiologist: Brittan Mapel Performed by: anesthesiologist  Preanesthetic Checklist Completed: patient identified, site marked, surgical consent, pre-op evaluation, timeout performed, IV checked, risks and benefits discussed and monitors and equipment checked Spinal Block Patient position: sitting Prep: ChloraPrep Patient monitoring: heart rate, continuous pulse ox and blood pressure Approach: right paramedian Location: L3-4 Injection technique: single-shot Needle Needle type: Sprotte  Needle gauge: 24 G Needle length: 9 cm Additional Notes Expiration date of kit checked and confirmed. Patient tolerated procedure well, without complications.     

## 2015-07-01 NOTE — Op Note (Signed)
Sara Padilla PROCEDURE DATE: 07/01/2015  PREOPERATIVE DIAGNOSES: Intrauterine pregnancy at [redacted]w[redacted]d weeks gestation; previous cesarean section x 2  POSTOPERATIVE DIAGNOSES: The same  PROCEDURE: Repeat Low Transverse Cesarean Section  SURGEON:  Dr. Jaynie Collins  ASSISTANT:  Dr. Lorain Childes  ANESTHESIOLOGIST: Dr. Saddie Benders  INDICATIONS: Sara Padilla is a 24 y.o. G3P3003 at [redacted]w[redacted]d here for repeat cesarean section secondary to the indications listed under preoperative diagnoses; please see preoperative note for further details.  The risks of cesarean section were discussed with the patient including but were not limited to: bleeding which may require transfusion or reoperation; infection which may require antibiotics; injury to bowel, bladder, ureters or other surrounding organs; injury to the fetus; need for additional procedures including hysterectomy in the event of a life-threatening hemorrhage; placental abnormalities wth subsequent pregnancies, incisional problems, thromboembolic phenomenon and other postoperative/anesthesia complications.   The patient concurred with the proposed plan, giving informed written consent for the procedure.    FINDINGS:  Viable female infant in cephalic presentation.  Apgars 9 and 9.  Loose body cord x 1.  Clear amniotic fluid. Very thin lower uterine segment. Increased bleeding near left uterine vessels, B-lynch suture placed.  Intact placenta, three vessel cord.  Normal uterus, fallopian tubes and ovaries bilaterally.  ANESTHESIA: Spinal INTRAVENOUS FLUIDS: 2600 ml ESTIMATED BLOOD LOSS: 600 ml URINE OUTPUT:  150 ml SPECIMENS: Placenta sent to pathology COMPLICATIONS: None immediate  PROCEDURE IN DETAIL:  The patient preoperatively received intravenous antibiotics and had sequential compression devices applied to her lower extremities.  She was then taken to the operating room where spinal anesthesia was administered and was found to be adequate. She  was then placed in a dorsal supine position with a leftward tilt, and prepped and draped in a sterile manner.  A foley catheter was placed into her bladder and attached to constant gravity.  After an adequate timeout was performed, a Pfannenstiel skin incision was made with scalpel and carried through to the underlying layer of fascia. The fascia was incised in the midline, and this incision was extended bilaterally using the Mayo scissors.  Kocher clamps were applied to the superior aspect of the fascial incision and the underlying rectus muscles were dissected off bluntly.  A similar process was carried out on the inferior aspect of the fascial incision. The rectus muscles were separated in the midline bluntly and the peritoneum was entered bluntly.  Attention was turned to the lower uterine segment where a low transverse hysterotomy was made with a scalpel and extended bilaterally bluntly.  The infant was successfully delivered, the cord was clamped and cut after one minute, and the infant was handed over to the awaiting neonatology team. Uterine massage was then administered, and the placenta delivered intact with a three-vessel cord. The uterus was then cleared of clot and debris.  The hysterotomy was closed with 0 Vicryl in a running locked fashion, and an imbricating layer was also placed with 0 Vicryl.  Figure-of-eight 0 Vicryl serosal stitches were placed to help with hemostasis.  There was some bleeding encountered from the left uterine vessels and a B-lynch suture was placed.  The pelvis was cleared of all clot and debris. Hemostasis was confirmed on all surfaces.  The peritoneum and the muscles were reapproximated using 2-0 Vicryl interrupted stitches. The fascia was then closed using 0 PDS in a running fashion.  The subcutaneous layer was irrigated,  and 30 ml of 0.5% Marcaine was injected subcutaneously around the incision.  The skin was  closed with a 4-0 Vicryl subcuticular stitch. The patient  tolerated the procedure well. Sponge, lap, instrument and needle counts were correct x 3.  She was taken to the recovery room in stable condition.    Jaynie CollinsUGONNA  ANYANWU, MD, FACOG Attending Obstetrician & Gynecologist Faculty Practice, [facility] [place]

## 2015-07-01 NOTE — Anesthesia Postprocedure Evaluation (Signed)
Anesthesia Post Note  Patient: Sara DiciccoProduct/process development scientist  Procedure(s) Performed: Procedure(s) (LRB): CESAREAN SECTION (N/A)  Patient location during evaluation: PACU Anesthesia Type: Spinal Level of consciousness: oriented and awake and alert Pain management: pain level controlled Vital Signs Assessment: post-procedure vital signs reviewed and stable Respiratory status: spontaneous breathing, respiratory function stable and patient connected to nasal cannula oxygen Cardiovascular status: blood pressure returned to baseline and stable Postop Assessment: no headache and no backache Anesthetic complications: no    Last Vitals:  Filed Vitals:   07/01/15 1045 07/01/15 1100  BP: 98/60 87/41  Pulse: 57 59  Temp:    Resp: 12 11    Last Pain:  Filed Vitals:   07/01/15 1106  PainSc: 0-No pain                 Phillips Groutarignan, Cade Olberding

## 2015-07-01 NOTE — H&P (Signed)
Obstetric Preoperative History and Physical  Frederic JerichoKayla Delprado is a 24 y.o. [id number] with IUP at 3450w0d presenting for presenting for scheduled repeat cesarean section.  No acute concerns.   [facility]  with [facility] care [facility] 32 weeks Pregnancy complications or risks: Patient Active Problem List   Diagnosis Date Noted  . Proteinuria affecting pregnancy in third trimester 05/30/2015  . Uterine size-date discrepancy in third trimester, antepartum 05/30/2015  . Previous cesarean delivery, antepartum condition or complication 05/16/2015  . Supervision of normal pregnancy 05/12/2015  . Thyroid disorder 05/12/2015  She plans to breastfeed She desires Mirena IUD for postpartum contraception.   Prenatal labs and studies: ABO, Rh: --/--/O POS, O POS (12/09 0900) Antibody: NEG (12/09 0900) Rubella: Immune (05/10 0000) RPR: Non Reactive (12/09 0900)  HBsAg: Negative (05/10 0000)  HIV: NONREACTIVE (11/28 1314)  GBS: NEGATIVE 1 hr Glucola  135, normal 3 hr GTT Genetic screening Quad: normal Anatomy US normal  History reviewed. No pertinent past medical history.  Past Surgical History  Procedure Laterality Date  . Cesarean section      X2    OB History  Gravida Para Term Preterm AB SAB TAB Ectopic Multiple Living  3 2 2  0 0 0 0 0 0 2    # Outcome Date GA Lbr Len/2nd Weight Sex Delivery Anes PTL Lv  3 Current           2 Term 07/19/08 6581w0d  6 lb 5 oz (2.863 kg) M CS-Unspec Spinal N Y  1 Term 03/11/07 8781w0d  6 lb 8 oz (2.948 kg) M CS-Unspec EPI,Gen N Y     Complications: Failure to Progress in Second Stage      Social History   Social History  . Marital Status: Single    Spouse Name: N/A  . Number of Children: N/A  . Years of Education: N/A   Social History Main Topics  . Smoking status: Former Smoker -- 0.50 packs/day for 4 years    Quit date: 10/19/2014  . Smokeless tobacco: Never Used  . Alcohol Use: No  . Drug Use: No  . Sexual Activity: Yes   Birth Control/ Protection: None   Other Topics Concern  . None   Social History Narrative    Family History  Problem Relation Age of Onset  . Cancer Father   . Cancer Paternal Grandmother     Prescriptions prior to admission  Medication Sig Dispense Refill Last Dose  . acetaminophen (TYLENOL) 500 MG tablet Take 500-1,000 mg by mouth every 6 (six) hours as needed for moderate pain or headache.   Taking  . diphenhydrAMINE (BENADRYL) 25 MG tablet Take 12.5 mg by mouth at bedtime.   Taking  . famotidine (PEPCID) 20 MG tablet Take 1 tablet (20 mg total) by mouth 2 (two) times daily. (Patient not taking: Reported on 06/26/2015) 60 tablet 3 Not Taking  . ondansetron (ZOFRAN ODT) 4 MG disintegrating tablet Take 1 tablet (4 mg total) by mouth every 8 (eight) hours as needed for nausea. (Patient not taking: Reported on 06/26/2015) 20 tablet 1 Not Taking  . Prenatal Vit-Fe Fumarate-FA (PRENATAL MULTIVITAMIN) TABS tablet Take 1 tablet by mouth daily at 12 noon.   Taking    Allergies  Allergen Reactions  . Codeine Hives  . Tramadol Hives    Review of Systems: Negative except for what is mentioned in HPI.  Physical Exam: BP 105/70 mmHg  Pulse 75  Temp(Src) 98.4 F (36.9 C) (  Oral)  Resp 18  SpO2 96%  LMP 10/14/2014 FHR : 135 bpm CONSTITUTIONAL: Well-developed, well-nourished female in no acute distress.  HENT:  Normocephalic, atraumatic, External right and left ear normal. Oropharynx is clear and moist EYES: Conjunctivae and EOM are normal. Pupils are equal, round, and reactive to light. No scleral icterus.  NECK: Normal range of motion, supple, no masses SKIN: Skin is warm and dry. No rash noted. Not diaphoretic. No erythema. No pallor. NEUROLGIC: Alert and oriented to person, place, and time. Normal reflexes, muscle tone coordination. No cranial nerve deficit noted. PSYCHIATRIC: Normal mood and affect. Normal behavior. Normal judgment and thought content. CARDIOVASCULAR: Normal heart  rate noted, regular rhythm RESPIRATORY: Effort and breath sounds normal, no problems with respiration noted ABDOMEN: Soft, nontender, nondistended, gravid. Well-healed Pfannenstiel incision. PELVIC: Deferred MUSCULOSKELETAL: Normal range of motion. No edema and no tenderness. 2+ distal pulses.   Pertinent Labs/Studies:   Results for orders placed or performed during the hospital encounter of 06/29/15 (from the past 72 hour(s))  CBC     Status: Abnormal   Collection Time: 06/29/15  9:00 AM  Result Value Ref Range   WBC 12.5 (H) 4.0 - 10.5 K/uL   RBC 3.53 (L) 3.87 - 5.11 MIL/uL   Hemoglobin 10.4 (L) 12.0 - 15.0 g/dL   HCT 16.1 (L) 09.6 - 04.5 %   MCV 90.1 78.0 - 100.0 fL   MCH 29.5 26.0 - 34.0 pg   MCHC 32.7 30.0 - 36.0 g/dL   RDW 40.9 81.1 - 91.4 %   Platelets 468 (H) 150 - 400 K/uL  RPR     Status: None   Collection Time: 06/29/15  9:00 AM  Result Value Ref Range   RPR Ser Ql Non Reactive Non Reactive    Comment: (NOTE) Performed At: [facility] [address] [place], [place] [id number] Mila Homer MD [id number]   Type and screen     Status: None   Collection Time: 06/29/15  9:00 AM  Result Value Ref Range   ABO/RH(D) O POS    Antibody Screen NEG    Sample Expiration 07/02/2015   ABO/Rh     Status: None   Collection Time: 06/29/15  9:00 AM  Result Value Ref Range   ABO/RH(D) O POS     Assessment and Plan :Anavictoria Wilk is a 24 y.o. G3P2002 at [redacted]w[redacted]d being admitted for scheduled repeat cesarean section. The risks of cesarean section discussed with the patient included but were not limited to: bleeding which may require transfusion or reoperation; infection which may require antibiotics; injury to bowel, bladder, ureters or other surrounding organs; injury to the fetus; need for additional procedures including hysterectomy in the event of a life-threatening hemorrhage; placental abnormalities wth subsequent pregnancies, incisional problems, thromboembolic  phenomenon and other postoperative/anesthesia complications. The patient concurred with the proposed plan, giving informed written consent for the procedure. Patient has been NPO since last night she will remain NPO for procedure. Anesthesia and OR aware. Preoperative prophylactic antibiotics and SCDs ordered on call to the OR. To OR when ready.    Jaynie Collins, MD, FACOG  Attending Obstetrician & Gynecologist  Faculty Practice, [facility]

## 2015-07-01 NOTE — Consult Note (Signed)
Neonatology Note:   Attendance at C-section:   I was asked by Dr. Anyanwu to attend this repeat C/S at term. The mother is a G3P2 O pos, GBS neg with late PNC. ROM at delivery, fluid clear. Infant vigorous with good spontaneous cry and tone. Delayed cord clamping was done. Needed no suctioning. Ap 9/9. Lungs clear to ausc in DR. To CN to care of Pediatrician.  Annya Lizana C. Kayler Buckholtz, MD 

## 2015-07-01 NOTE — Transfer of Care (Signed)
Immediate Anesthesia Transfer of Care Note  Patient: Sara Padilla  Procedure(s) Performed: Procedure(s): CESAREAN SECTION (N/A)  Patient Location: PACU  Anesthesia Type:Spinal  Level of Consciousness: awake, alert  and oriented  Airway & Oxygen Therapy: Patient Spontanous Breathing  Post-op Assessment: Report given to RN and Post -op Vital signs reviewed and stable  Post vital signs: Reviewed and stable  Last Vitals:  Filed Vitals:   07/01/15 0805 07/01/15 0816  BP: 103/60 105/70  Pulse: 75 75  Temp: 36.9 C   Resp: 18     Complications: No apparent anesthesia complications

## 2015-07-02 ENCOUNTER — Encounter (HOSPITAL_COMMUNITY): Payer: Self-pay | Admitting: Obstetrics & Gynecology

## 2015-07-02 LAB — CBC
HEMATOCRIT: 24.2 % — AB (ref 36.0–46.0)
HEMOGLOBIN: 8 g/dL — AB (ref 12.0–15.0)
MCH: 29.6 pg (ref 26.0–34.0)
MCHC: 33.1 g/dL (ref 30.0–36.0)
MCV: 89.6 fL (ref 78.0–100.0)
Platelets: 338 10*3/uL (ref 150–400)
RBC: 2.7 MIL/uL — ABNORMAL LOW (ref 3.87–5.11)
RDW: 13.1 % (ref 11.5–15.5)
WBC: 13.2 10*3/uL — ABNORMAL HIGH (ref 4.0–10.5)

## 2015-07-02 MED ORDER — SALINE SPRAY 0.65 % NA SOLN
1.0000 | NASAL | Status: DC | PRN
  Administered 2015-07-02: 1 via NASAL
  Filled 2015-07-02: qty 44

## 2015-07-02 NOTE — Lactation Note (Signed)
This note was copied from the chart of Sara Padilla. Lactation Consultation Note Follow up note.  Mom states baby is nursing much better today.  Reviewed importance of feeding with any feeding cue.  Encouraged to call with concerns/assist prn.  Patient Name: Sara BattyBoy Tuana Padilla ZOXWR'UToday's Date: 07/02/2015     Maternal Data    Feeding Length of feed: 15 min  LATCH Score/Interventions                      Lactation Tools Discussed/Used     Consult Status      Huston FoleyMOULDEN, Iliyana Convey S 07/02/2015, 3:02 PM

## 2015-07-02 NOTE — Lactation Note (Addendum)
This note was copied from the chart of Sara Padilla. Lactation Consultation Note New mom w/newborn that has been sleepy and gaggy. Spiting up clear mucous. Mom has c-section. FOB at bedside. Encouraged to wake baby up for Bf at least every two-three hrs. Hand expression of breast demonstrated 2ml thick colostrum. Mom slightly awkward w/holding and positioning baby at breast/ discussed positioning. Mom has long nails, encouraged her to cut and file them today so the baby doesn't get cut, and so she can unlatch the baby if there is a wrong latch. Mom appears to be uncomfortable holding and positioning baby to the breast. Demonstrated mom BF in assisting positions and latching, then asked mom to do teach back multiple times, needed cueing. Baby although gaggy at times and has a stuffy nose, latched and suckled well using t-cup hold of breast tissue. Mom has short shaft very compressible areola and nipple for a deep latch. Encouraged mom to be a little more aggressive in feeding. Mom encouraged to feed baby 8-12 times/24 hours and with feeding cues. Mom encouraged to waken baby for feeds. Mom encouraged to do skin-to-skin. Educated about newborn behavior, I&O, cluster feeding, supply and demand. Referred to Baby and Me Book in Breastfeeding section Pg. 22-23 for position options and Proper latch demonstration. WH/LC brochure given w/resources, support groups and LC services. Hand expression taught w/82ml thick colostrum given w/curve tip syring. Encouraged breast massage or hand expression after BF to supplement w/colostrum. Has hand pump in room.          Patient Name: Sara Frederic JerichoKayla Pomales ONGEX'BToday's Date: 07/02/2015 Reason for consult: Initial assessment   Maternal Data Has patient been taught Hand Expression?: Yes Does the patient have breastfeeding experience prior to this delivery?: No  Feeding Feeding Type: Breast Milk Length of feed: 7 min  LATCH Score/Interventions Latch: Repeated  attempts needed to sustain latch, nipple held in mouth throughout feeding, stimulation needed to elicit sucking reflex. Intervention(s): Adjust position;Assist with latch;Breast massage;Breast compression  Audible Swallowing: A few with stimulation Intervention(s): Skin to skin;Hand expression Intervention(s): Alternate breast massage  Type of Nipple: Everted at rest and after stimulation  Comfort (Breast/Nipple): Soft / non-tender     Hold (Positioning): Assistance needed to correctly position infant at breast and maintain latch. Intervention(s): Skin to skin;Position options;Support Pillows;Breastfeeding basics reviewed  LATCH Score: 7  Lactation Tools Discussed/Used Tools: Pump Breast pump type: Manual WIC Program: Yes Pump Review: Setup, frequency, and cleaning;Milk Storage Initiated by:: RN Date initiated:: 07/01/15   Consult Status Consult Status: Follow-up Date: 07/02/15 Follow-up type: In-patient    Charyl DancerCARVER, Gerhardt Gleed G 07/02/2015, 5:38 AM

## 2015-07-02 NOTE — Anesthesia Postprocedure Evaluation (Signed)
Anesthesia Post Note  Patient: [name]  Procedure(s) Performed: Procedure(s) (LRB): CESAREAN SECTION (N/A)  Patient location during evaluation: Mother Baby Anesthesia Type: Spinal Level of consciousness: awake Pain management: satisfactory to patient Vital Signs Assessment: post-procedure vital signs reviewed and stable Respiratory status: spontaneous breathing Cardiovascular status: stable Anesthetic complications: no    Last Vitals:  Filed Vitals:   07/01/15 2345 07/02/15 0400  BP: 93/42 93/55  Pulse: 57 58  Temp: 37.3 C 36.9 C  Resp: 18 18    Last Pain:  Filed Vitals:   07/02/15 0643  PainSc: 5                  Sara Padilla

## 2015-07-02 NOTE — Progress Notes (Signed)
Subjective: Postpartum Day 1: Cesarean Delivery Patient reports incisional pain and tolerating PO.    Objective: Vital signs in last 24 hours: Temp:  [97.7 F (36.5 C)-99.4 F (37.4 C)] 98.5 F (36.9 C) (12/12 0400) Pulse Rate:  [47-78] 58 (12/12 0400) Resp:  [11-18] 18 (12/12 0400) BP: (73-113)/(34-71) 93/55 mmHg (12/12 0400) SpO2:  [96 %-100 %] 97 % (12/12 0400)  Physical Exam:  General: alert, cooperative, appears stated age and no distress Lochia: appropriate Uterine Fundus: firm Incision: no significant drainage DVT Evaluation: Negative Homan's sign. No cords or calf tenderness.   Recent Labs  06/29/15 0900  HGB 10.4*  HCT 31.8*    Assessment/Plan: Status post Cesarean section. Doing well postoperatively.  Continue current care.  LAWSON, MARIE DARLENE 07/02/2015, 5:25 AM

## 2015-07-02 NOTE — Progress Notes (Signed)
UR chart review completed.  

## 2015-07-02 NOTE — Addendum Note (Signed)
Addendum  created 07/02/15 [id number] by Algis GreenhouseLinda A Kassady Laboy, CRNA   Modules edited: Clinical Notes   Clinical Notes:  File: [id number]

## 2015-07-03 MED ORDER — DOCUSATE SODIUM 100 MG PO CAPS: 100.0000 mg | ORAL_CAPSULE | Freq: Two times a day (BID) | ORAL | Status: AC | PRN

## 2015-07-03 MED ORDER — OXYCODONE-ACETAMINOPHEN 5-325 MG PO TABS: 1.0000 | ORAL_TABLET | ORAL | Status: DC | PRN

## 2015-07-03 MED ORDER — IBUPROFEN 600 MG PO TABS: 600.0000 mg | ORAL_TABLET | Freq: Four times a day (QID) | ORAL | Status: AC

## 2015-07-03 MED ORDER — OXYCODONE-ACETAMINOPHEN 5-325 MG PO TABS: 1.0000 | ORAL_TABLET | ORAL | Status: AC | PRN

## 2015-07-03 NOTE — Discharge Instructions (Signed)
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
## 2015-07-03 NOTE — Discharge Summary (Signed)
Obstetric Discharge Summary Reason for Admission: scheduled RLTCS Prenatal Procedures: ultrasound Intrapartum Procedures: cesarean: low cervical, transverse Postpartum Procedures: none Complications-Operative and Postpartum: none HEMOGLOBIN  Date Value Ref Range Status  07/02/2015 8.0* 12.0 - 15.0 g/dL Final  16/10/960409/16/2016 54.010.4 g/dL Final   HCT  Date Value Ref Range Status  07/02/2015 24.2* 36.0 - 46.0 % Final  11/28/2014 40 % Final    Physical Exam:  General: alert, cooperative and no distress Lochia: appropriate Uterine Fundus: firm Incision: no significant drainage, honeycomb dressing intact with scant dark/old bleeding noted DVT Evaluation: No evidence of DVT seen on physical exam. Negative Homan's sign. No cords or calf tenderness. No significant calf/ankle edema.  Discharge Diagnoses: Term Pregnancy-delivered  Discharge Information: Date: 07/03/2015 Activity: pelvic rest Diet: routine Medications: Ibuprofen, Colace and Percocet Condition: stable Instructions: refer to practice specific booklet Discharge to: home  Contraception: Mirena IUD  Follow-up Information    [facility]   Specialty:  Obstetrics and Gynecology   Why:  In 4-6 weeks for postpartum visit/IUD placement   Contact information:   [address] [place] [id number] [phone]      Newborn Data: Live born female  Birth Weight: 6 lb 3.3 oz (2815 g) APGAR: 9, 9  Home with mother.  Sara Padilla, Sara Padilla 07/03/2015, 6:30 AM

## 2015-07-03 NOTE — Lactation Note (Addendum)
This note was copied from the chart of Sara Padilla. Lactation Consultation Note  7.3% weight loss.  Baby < 6 lbs. Parents deny problems or questions. Parents state baby recently breastfed for 30 min.  Suggest they call for LC to view next feeding. Discussed post pumping a few times a day once mother goes home and give volume pumped back to baby on spoon. Reminded mother to breastfeed on both breasts burping in between. Encouraged parents to wake baby for feedings at least every 3 hours and feed on cue.   Reviewed engorgement care and monitoring voids/stools. Provided mother w/ hand pump.  1255 Parents called to view feeding.  Baby latched in football position on tip of mother's nipple. Assisted mother w/ repositioning baby deep on the breast and provided explanation that the improved depth with increase transfer of breastmilk and reduce soreness. Reviewed how to [facility] and positioning tips. Discussed using foley cup to give baby back pumped breastmilk. Parents seem to be willing to follow plan.  Patient Name: Sara BattyBoy Sara Padilla ZOXWR'UToday's Date: 07/03/2015 Reason for consult: Follow-up assessment   Maternal Data    Feeding Feeding Type: Breast Fed Length of feed: 30 min  LATCH Score/Interventions                      Lactation Tools Discussed/Used     Consult Status Consult Status: Follow-up Date: 07/03/15 Follow-up type: In-patient    Dahlia ByesBerkelhammer, Anahla Bevis [facility] 07/03/2015, 11:56 AM

## 2015-08-02 ENCOUNTER — Encounter: Payer: Self-pay | Admitting: Obstetrics & Gynecology

## 2015-08-02 ENCOUNTER — Ambulatory Visit (INDEPENDENT_AMBULATORY_CARE_PROVIDER_SITE_OTHER): Payer: Medicaid Other | Admitting: Obstetrics & Gynecology

## 2015-08-02 DIAGNOSIS — Z3202 Encounter for pregnancy test, result negative: Secondary | ICD-10-CM | POA: Diagnosis not present

## 2015-08-02 DIAGNOSIS — Z30430 Encounter for insertion of intrauterine contraceptive device: Secondary | ICD-10-CM

## 2015-08-02 LAB — POCT PREGNANCY, URINE: Preg Test, Ur: NEGATIVE

## 2015-08-02 MED ORDER — LEVONORGESTREL 18.6 MCG/DAY IU IUD
INTRAUTERINE_SYSTEM | Freq: Once | INTRAUTERINE | Status: AC
  Administered 2015-08-02: 16:00:00 via INTRAUTERINE

## 2015-08-02 NOTE — Patient Instructions (Signed)
Levonorgestrel intrauterine device (IUD) What is this medicine? LEVONORGESTREL IUD (LEE voe nor jes trel) is a contraceptive (birth control) device. The device is placed inside the uterus by a healthcare professional. It is used to prevent pregnancy and can also be used to treat heavy bleeding that occurs during your period. Depending on the device, it can be used for 3 to 5 years. This medicine may be used for other purposes; ask your health care provider or pharmacist if you have questions. What should I tell my health care provider before I take this medicine? They need to know if you have any of these conditions: -abnormal Pap smear -cancer of the breast, uterus, or cervix -diabetes -endometritis -genital or pelvic infection now or in the past -have more than one sexual partner or your partner has more than one partner -heart disease -history of an ectopic or tubal pregnancy -immune system problems -IUD in place -liver disease or tumor -problems with blood clots or take blood-thinners -use intravenous drugs -uterus of unusual shape -vaginal bleeding that has not been explained -an unusual or allergic reaction to levonorgestrel, other hormones, silicone, or polyethylene, medicines, foods, dyes, or preservatives -pregnant or trying to get pregnant -breast-feeding How should I use this medicine? This device is placed inside the uterus by a health care professional. Talk to your pediatrician regarding the use of this medicine in children. Special care may be needed. Overdosage: If you think you have taken too much of this medicine contact a poison control center or emergency room at once. NOTE: This medicine is only for you. Do not share this medicine with others. What if I miss a dose? This does not apply. What may interact with this medicine? Do not take this medicine with any of the following medications: -amprenavir -bosentan -fosamprenavir This medicine may also interact with  the following medications: -aprepitant -barbiturate medicines for inducing sleep or treating seizures -bexarotene -griseofulvin -medicines to treat seizures like carbamazepine, ethotoin, felbamate, oxcarbazepine, phenytoin, topiramate -modafinil -pioglitazone -rifabutin -rifampin -rifapentine -some medicines to treat HIV infection like atazanavir, indinavir, lopinavir, nelfinavir, tipranavir, ritonavir -St. John's wort -warfarin This list may not describe all possible interactions. Give your health care provider a list of all the medicines, herbs, non-prescription drugs, or dietary supplements you use. Also tell them if you smoke, drink alcohol, or use illegal drugs. Some items may interact with your medicine. What should I watch for while using this medicine? Visit your doctor or health care professional for regular check ups. See your doctor if you or your partner has sexual contact with others, becomes HIV positive, or gets a sexual transmitted disease. This product does not protect you against HIV infection (AIDS) or other sexually transmitted diseases. You can check the placement of the IUD yourself by reaching up to the top of your vagina with clean fingers to feel the threads. Do not pull on the threads. It is a good habit to check placement after each menstrual period. Call your doctor right away if you feel more of the IUD than just the threads or if you cannot feel the threads at all. The IUD may come out by itself. You may become pregnant if the device comes out. If you notice that the IUD has come out use a backup birth control method like condoms and call your health care provider. Using tampons will not change the position of the IUD and are okay to use during your period. What side effects may I notice from receiving this medicine?   Side effects that you should report to your doctor or health care professional as soon as possible: -allergic reactions like skin rash, itching or  hives, swelling of the face, lips, or tongue -fever, flu-like symptoms -genital sores -high blood pressure -no menstrual period for 6 weeks during use -pain, swelling, warmth in the leg -pelvic pain or tenderness -severe or sudden headache -signs of pregnancy -stomach cramping -sudden shortness of breath -trouble with balance, talking, or walking -unusual vaginal bleeding, discharge -yellowing of the eyes or skin Side effects that usually do not require medical attention (report to your doctor or health care professional if they continue or are bothersome): -acne -breast pain -change in sex drive or performance -changes in weight -cramping, dizziness, or faintness while the device is being inserted -headache -irregular menstrual bleeding within first 3 to 6 months of use -nausea This list may not describe all possible side effects. Call your doctor for medical advice about side effects. You may report side effects to FDA at 1-800-FDA-1088. Where should I keep my medicine? This does not apply. NOTE: This sheet is a summary. It may not cover all possible information. If you have questions about this medicine, talk to your doctor, pharmacist, or health care provider.    2016, Elsevier/Gold Standard. (2011-08-07 13:54:04)  

## 2015-08-02 NOTE — Progress Notes (Signed)
Patient ID: Sara JerichoKayla Vise, female   DOB: 1991-07-11, 25 y.o.   MRN: [id number] Subjective:     Sara Padilla is a 25 y.o. female who presents for a postpartum visit. She is 4 weeks postpartum following a low cervical transverse Cesarean section. I have fully reviewed the prenatal and intrapartum course. The delivery was at 39 gestational weeks. Outcome: repeat cesarean section, low transverse incision. Anesthesia: spinal. Postpartum course has been unremarkable. Baby's course has been unremarkable. Baby is feeding by breast. Bleeding no bleeding. Bowel function is normal. Bladder function is normal. Patient is not sexually active. Contraception method is IUD. Postpartum depression screening: negative.  The following portions of the patient's history were reviewed and updated as appropriate: allergies, current medications, past family history, past medical history, past social history, past surgical history and problem list.  Review of Systems incisional pain   Objective:    BP 106/70 mmHg  Pulse 67  Temp(Src) 98.7 F (37.1 C) (Oral)  Wt 115 lb 3.2 oz (52.254 kg)  Breastfeeding? Yes  General:  alert, cooperative and no distress           Abdomen: soft, non-tender; bowel sounds normal; no masses,  no organomegaly and incision healing well   Vulva:  normal  Vagina: normal vagina  Cervix:  no lesions  Corpus: normal  Adnexa:  not evaluated  Rectal Exam: Not performed.      Liletta insertion Patient identified, informed consent performed, signed copy in chart, time out was performed.  Urine pregnancy test negative.  Speculum placed in the vagina.  Cervix visualized.  Cleaned with Betadine x 2.  Grasped anteriourly with a single tooth tenaculum.  Uterus sounded to 8 cm.  Liletta IUD placed per manufacturer's recommendations.  Strings trimmed to 3 cm.   Patient given post procedure instructions and Mirena care card with expiration date.  Patient is asked to check IUD strings periodically and  follow up in 4-6 weeks for IUD check. Assessment:     normal  postpartum exam. Pap smear not done at today's visit.   Plan:    1. Contraception: IUD 2. Liletta placed 3. Follow up in: 4 weeks or as needed. String check  Adam PhenixJames G Arnold, MD 08/02/2015

## 2015-09-03 ENCOUNTER — Encounter: Payer: Self-pay | Admitting: Obstetrics & Gynecology

## 2015-09-03 ENCOUNTER — Ambulatory Visit (INDEPENDENT_AMBULATORY_CARE_PROVIDER_SITE_OTHER): Payer: Medicaid Other | Admitting: Obstetrics & Gynecology

## 2015-09-03 VITALS — BP 112/69 | HR 56 | Temp 98.4°F | Ht 65.0 in | Wt 111.1 lb

## 2015-09-03 DIAGNOSIS — Z30431 Encounter for routine checking of intrauterine contraceptive device: Secondary | ICD-10-CM

## 2015-09-03 DIAGNOSIS — N938 Other specified abnormal uterine and vaginal bleeding: Secondary | ICD-10-CM

## 2015-09-03 DIAGNOSIS — T839XXA Unspecified complication of genitourinary prosthetic device, implant and graft, initial encounter: Secondary | ICD-10-CM | POA: Insufficient documentation

## 2015-09-03 NOTE — Progress Notes (Signed)
Cc:Pt reports severe cramping at times; ibuprofen/tylenol is not effective.   Marland Kitchengp No LMP recorded. Here for string check after IUD insertion. Daily bleeding and cramps noted, worse when nursing.. Dark red blood noted and no clots or heavy flow.  No past medical history on file. Allergies  Allergen Reactions  . Codeine Hives  . Tramadol Hives   Past Surgical History  Procedure Laterality Date  . Cesarean section      X2  . Cesarean section N/A 07/01/2015    Procedure: CESAREAN SECTION;  Surgeon: Tereso Newcomer, MD;  Location: [facility];  Service: Obstetrics;  Laterality: N/A;   Current Outpatient Prescriptions on File Prior to Visit  Medication Sig Dispense Refill  . ibuprofen (ADVIL,MOTRIN) 600 MG tablet Take 1 tablet (600 mg total) by mouth every 6 (six) hours. 30 tablet 0  . docusate sodium (COLACE) 100 MG capsule Take 1 capsule (100 mg total) by mouth 2 (two) times daily as needed. (Patient not taking: Reported on 08/02/2015) 30 capsule 0  . oxyCODONE-acetaminophen (PERCOCET/ROXICET) 5-325 MG tablet Take 1-2 tablets by mouth every 4 (four) hours as needed (for pain scale greater than 7). (Patient not taking: Reported on 08/02/2015) 30 tablet 0  . Prenatal Vit-Fe Fumarate-FA (PRENATAL MULTIVITAMIN) TABS tablet Take 1 tablet by mouth daily at 12 noon. Reported on 09/03/2015     No current facility-administered medications on file prior to visit.   Blood pressure 112/69, pulse 56, temperature 98.4 F (36.9 C), temperature source Oral, height  (1.651 m), weight 111 lb 1.6 oz (50.395 kg), currently breastfeeding. NAD, alert Pelvic exam: normal external genitalia, vulva, vagina, cervix, uterus and adnexa, CERVIX: normal appearing cervix without discharge or lesions, dark blood noted, string 2-3 cm , UTERUS: uterus is normal size, shape, consistency and nontender, ADNEXA: normal adnexa in size, nontender and no masses  Impression: appears to have normal IUD placement with increased pain  and persistent bleeding  Plan: Pelvic US for IUD position and RTC to review.  Adam Phenix, MD 09/03/2015

## 2015-09-03 NOTE — Patient Instructions (Signed)
Levonorgestrel intrauterine device (IUD) What is this medicine? LEVONORGESTREL IUD (LEE voe nor jes trel) is a contraceptive (birth control) device. The device is placed inside the uterus by a healthcare professional. It is used to prevent pregnancy and can also be used to treat heavy bleeding that occurs during your period. Depending on the device, it can be used for 3 to 5 years. This medicine may be used for other purposes; ask your health care provider or pharmacist if you have questions. What should I tell my health care provider before I take this medicine? They need to know if you have any of these conditions: -abnormal Pap smear -cancer of the breast, uterus, or cervix -diabetes -endometritis -genital or pelvic infection now or in the past -have more than one sexual partner or your partner has more than one partner -heart disease -history of an ectopic or tubal pregnancy -immune system problems -IUD in place -liver disease or tumor -problems with blood clots or take blood-thinners -use intravenous drugs -uterus of unusual shape -vaginal bleeding that has not been explained -an unusual or allergic reaction to levonorgestrel, other hormones, silicone, or polyethylene, medicines, foods, dyes, or preservatives -pregnant or trying to get pregnant -breast-feeding How should I use this medicine? This device is placed inside the uterus by a health care professional. Talk to your pediatrician regarding the use of this medicine in children. Special care may be needed. Overdosage: If you think you have taken too much of this medicine contact a poison control center or emergency room at once. NOTE: This medicine is only for you. Do not share this medicine with others. What if I miss a dose? This does not apply. What may interact with this medicine? Do not take this medicine with any of the following medications: -amprenavir -bosentan -fosamprenavir This medicine may also interact with  the following medications: -aprepitant -barbiturate medicines for inducing sleep or treating seizures -bexarotene -griseofulvin -medicines to treat seizures like carbamazepine, ethotoin, felbamate, oxcarbazepine, phenytoin, topiramate -modafinil -pioglitazone -rifabutin -rifampin -rifapentine -some medicines to treat HIV infection like atazanavir, indinavir, lopinavir, nelfinavir, tipranavir, ritonavir -St. John's wort -warfarin This list may not describe all possible interactions. Give your health care provider a list of all the medicines, herbs, non-prescription drugs, or dietary supplements you use. Also tell them if you smoke, drink alcohol, or use illegal drugs. Some items may interact with your medicine. What should I watch for while using this medicine? Visit your doctor or health care professional for regular check ups. See your doctor if you or your partner has sexual contact with others, becomes HIV positive, or gets a sexual transmitted disease. This product does not protect you against HIV infection (AIDS) or other sexually transmitted diseases. You can check the placement of the IUD yourself by reaching up to the top of your vagina with clean fingers to feel the threads. Do not pull on the threads. It is a good habit to check placement after each menstrual period. Call your doctor right away if you feel more of the IUD than just the threads or if you cannot feel the threads at all. The IUD may come out by itself. You may become pregnant if the device comes out. If you notice that the IUD has come out use a backup birth control method like condoms and call your health care provider. Using tampons will not change the position of the IUD and are okay to use during your period. What side effects may I notice from receiving this medicine?   Side effects that you should report to your doctor or health care professional as soon as possible: -allergic reactions like skin rash, itching or  hives, swelling of the face, lips, or tongue -fever, flu-like symptoms -genital sores -high blood pressure -no menstrual period for 6 weeks during use -pain, swelling, warmth in the leg -pelvic pain or tenderness -severe or sudden headache -signs of pregnancy -stomach cramping -sudden shortness of breath -trouble with balance, talking, or walking -unusual vaginal bleeding, discharge -yellowing of the eyes or skin Side effects that usually do not require medical attention (report to your doctor or health care professional if they continue or are bothersome): -acne -breast pain -change in sex drive or performance -changes in weight -cramping, dizziness, or faintness while the device is being inserted -headache -irregular menstrual bleeding within first 3 to 6 months of use -nausea This list may not describe all possible side effects. Call your doctor for medical advice about side effects. You may report side effects to FDA at 1-800-FDA-1088. Where should I keep my medicine? This does not apply. NOTE: This sheet is a summary. It may not cover all possible information. If you have questions about this medicine, talk to your doctor, pharmacist, or health care provider.    2016, Elsevier/Gold Standard. (2011-08-07 13:54:04)  

## 2015-09-11 ENCOUNTER — Ambulatory Visit (HOSPITAL_COMMUNITY)
Admission: RE | Admit: 2015-09-11 | Discharge: 2015-09-11 | Disposition: A | Discharge status: Home / Self Care | Payer: Medicaid Other | Source: Ambulatory Visit | Attending: Obstetrics & Gynecology | Admitting: Obstetrics & Gynecology

## 2015-09-11 DIAGNOSIS — R109 Unspecified abdominal pain: Secondary | ICD-10-CM | POA: Insufficient documentation

## 2015-09-11 DIAGNOSIS — Z30431 Encounter for routine checking of intrauterine contraceptive device: Secondary | ICD-10-CM | POA: Diagnosis not present

## 2015-09-11 DIAGNOSIS — T839XXA Unspecified complication of genitourinary prosthetic device, implant and graft, initial encounter: Secondary | ICD-10-CM

## 2015-10-01 ENCOUNTER — Ambulatory Visit: Payer: Medicaid Other | Admitting: Obstetrics & Gynecology

## 2016-06-01 ENCOUNTER — Encounter (HOSPITAL_COMMUNITY): Payer: Self-pay

## 2016-06-01 ENCOUNTER — Emergency Department (HOSPITAL_COMMUNITY): Payer: Self-pay

## 2016-06-01 ENCOUNTER — Emergency Department (HOSPITAL_COMMUNITY)
Admission: EM | Admit: 2016-06-01 | Discharge: 2016-06-01 | Disposition: A | Discharge status: Home / Self Care | Payer: Self-pay | Attending: Emergency Medicine | Admitting: Emergency Medicine

## 2016-06-01 DIAGNOSIS — Z87891 Personal history of nicotine dependence: Secondary | ICD-10-CM | POA: Insufficient documentation

## 2016-06-01 DIAGNOSIS — N939 Abnormal uterine and vaginal bleeding, unspecified: Secondary | ICD-10-CM | POA: Diagnosis present

## 2016-06-01 DIAGNOSIS — R102 Pelvic and perineal pain: Secondary | ICD-10-CM | POA: Insufficient documentation

## 2016-06-01 LAB — BASIC METABOLIC PANEL
ANION GAP: 8 (ref 5–15)
BUN: 8 mg/dL (ref 6–20)
CHLORIDE: 104 mmol/L (ref 101–111)
CO2: 26 mmol/L (ref 22–32)
Calcium: 9.3 mg/dL (ref 8.9–10.3)
Creatinine, Ser: 0.65 mg/dL (ref 0.44–1.00)
GFR calc non Af Amer: >60 mL/min (ref >60)
Glucose, Bld: 121 mg/dL — ABNORMAL HIGH (ref 65–99)
Potassium: 4 mmol/L (ref 3.5–5.1)
SODIUM: 138 mmol/L (ref 135–145)

## 2016-06-01 LAB — URINALYSIS, ROUTINE W REFLEX MICROSCOPIC
BILIRUBIN URINE: NEGATIVE
Glucose, UA: NEGATIVE mg/dL
Ketones, ur: NEGATIVE mg/dL
Leukocytes, UA: NEGATIVE
NITRITE: NEGATIVE
PROTEIN: NEGATIVE mg/dL
SPECIFIC GRAVITY, URINE: 1.004 — AB (ref 1.005–1.030)
pH: 6.5 (ref 5.0–8.0)

## 2016-06-01 LAB — URINE MICROSCOPIC-ADD ON
Bacteria, UA: NONE SEEN
WBC UA: NONE SEEN WBC/hpf (ref 0–5)

## 2016-06-01 LAB — WET PREP, GENITAL
Clue Cells Wet Prep HPF POC: NONE SEEN
SPERM: NONE SEEN
TRICH WET PREP: NONE SEEN
YEAST WET PREP: NONE SEEN

## 2016-06-01 LAB — CBC WITH DIFFERENTIAL/PLATELET
BASOS ABS: 0.1 10*3/uL (ref 0.0–0.1)
BASOS PCT: 0 %
EOS ABS: 0.1 10*3/uL (ref 0.0–0.7)
Eosinophils Relative: 1 %
HEMATOCRIT: 46.2 % — AB (ref 36.0–46.0)
HEMOGLOBIN: 15.8 g/dL — AB (ref 12.0–15.0)
Lymphocytes Relative: 23 %
Lymphs Abs: 2.9 10*3/uL (ref 0.7–4.0)
MCH: 31.5 pg (ref 26.0–34.0)
MCHC: 34.2 g/dL (ref 30.0–36.0)
MCV: 92.2 fL (ref 78.0–100.0)
Monocytes Absolute: 0.7 10*3/uL (ref 0.1–1.0)
Monocytes Relative: 6 %
NEUTROS ABS: 8.6 10*3/uL — AB (ref 1.7–7.7)
NEUTROS PCT: 70 %
Platelets: 402 10*3/uL — ABNORMAL HIGH (ref 150–400)
RBC: 5.01 MIL/uL (ref 3.87–5.11)
RDW: 12.6 % (ref 11.5–15.5)
WBC: 12.3 10*3/uL — AB (ref 4.0–10.5)

## 2016-06-01 LAB — I-STAT BETA HCG BLOOD, ED (MC, WL, AP ONLY)

## 2016-06-01 NOTE — ED Provider Notes (Signed)
[facility]-EMERGENCY DEPT Provider Note   CSN: [id number] Arrival date & time: 06/01/16  0906     History   Chief Complaint Chief Complaint  Patient presents with  . Vaginal Bleeding  . Abdominal Cramping    HPI Sara Padilla is a 25 y.o. female G3P3 presenting with vaginal bleeding and lower left quadrant pain 3 days. She states that she had an IUD placed in January right after having her last child in December. All 3 children were C-sections. She states before having the IUD placed she had a history of heavy menstrual bleeding. She states that the last 3 days she has been bleeding uncontrollably with using about 5 pads a day. She states she has associated lower left sided abdominal pain. She admits to headaches and weakness. She denies chest pain, shortness of breath, urinary symptoms, nausea, vomiting, diarrhea, and constipation.   Vaginal Bleeding  Primary symptoms include pelvic pain, vaginal bleeding. Associated symptoms include abdominal pain. Pertinent negatives include no constipation, no diarrhea, no nausea, no vomiting, no light-headedness and no dizziness.  Abdominal Cramping  Associated symptoms include abdominal pain and headaches. Pertinent negatives include no chest pain and no shortness of breath.    History reviewed. No pertinent past medical history.  Patient Active Problem List   Diagnosis Date Noted  . Pelvic pain 06/01/2016  . Vaginal bleeding 06/01/2016  . Symptom related to intrauterine device (IUD) (HCC) 09/03/2015  . S/P cesarean section 05/12/2015  . Thyroid disorder 05/12/2015    Past Surgical History:  Procedure Laterality Date  . CESAREAN SECTION     X2  . CESAREAN SECTION N/A 07/01/2015   Procedure: CESAREAN SECTION;  Surgeon: Tereso Newcomer, MD;  Location: [facility];  Service: Obstetrics;  Laterality: N/A;    OB History    Gravida Para Term Preterm AB Living   3 3 3  0 0 3   SAB TAB Ectopic Multiple Live Births   0 0 0 0 3       Home  Medications    Prior to Admission medications   Medication Sig Start Date End Date Taking? Authorizing Provider  HYDROcodone-acetaminophen (NORCO/VICODIN) 5-325 MG tablet Take 1 tablet by mouth every 6 (six) hours as needed for moderate pain.   Yes Historical Provider, MD  ibuprofen (ADVIL,MOTRIN) 200 MG tablet Take 400 mg by mouth every 6 (six) hours as needed for moderate pain.   Yes Historical Provider, MD  docusate sodium (COLACE) 100 MG capsule Take 1 capsule (100 mg total) by mouth 2 (two) times daily as needed. Patient not taking: Reported on 08/02/2015 07/03/15   Misty Stanley A Leftwich-Kirby, CNM  ibuprofen (ADVIL,MOTRIN) 600 MG tablet Take 1 tablet (600 mg total) by mouth every 6 (six) hours. Patient not taking: Reported on 06/01/2016 07/03/15   Wilmer Floor Leftwich-Kirby, CNM  oxyCODONE-acetaminophen (PERCOCET/ROXICET) 5-325 MG tablet Take 1-2 tablets by mouth every 4 (four) hours as needed (for pain scale greater than 7). Patient not taking: Reported on 08/02/2015 07/03/15   Olivia Canter, MD    Family History Family History  Problem Relation Age of Onset  . Cancer Father   . Cancer Paternal Grandmother     Social History Social History  Substance Use Topics  . Smoking status: Former Smoker    Packs/day: 0.50    Years: 4.00    Quit date: 10/19/2014  . Smokeless tobacco: Never Used  . Alcohol use No     Allergies   Codeine and Tramadol   Review of Systems Review  of Systems  Constitutional: Positive for fatigue. Negative for chills and fever.  Respiratory: Negative for shortness of breath.   Cardiovascular: Negative for chest pain.  Gastrointestinal: Positive for abdominal pain. Negative for anal bleeding, constipation, diarrhea, nausea and vomiting.  Genitourinary: Positive for pelvic pain and vaginal bleeding. Negative for difficulty urinating, flank pain, hematuria and vaginal discharge.  Neurological: Positive for weakness and headaches. Negative for dizziness and  light-headedness.  Hematological: Does not bruise/bleed easily.  All other systems reviewed and are negative.    Physical Exam Updated Vital Signs BP 106/72   Pulse 84   Temp 97.8 F (36.6 C) (Oral)   Resp 16   SpO2 96%   Physical Exam  Constitutional: She is oriented to person, place, and time. She appears well-developed and well-nourished.  HENT:  Head: Normocephalic.  Cardiovascular: Regular rhythm and normal heart sounds.   Pulmonary/Chest: Effort normal and breath sounds normal.  Abdominal: Soft. She exhibits no distension and no mass. There is tenderness (LLQ). There is no rebound and no guarding.  Genitourinary: Pelvic exam was performed with patient supine. There is no rash on the right labia. There is no rash on the left labia. Cervix exhibits no discharge. Right adnexum displays no mass and no tenderness. Left adnexum displays no mass and no tenderness. There is bleeding in the vagina. No vaginal discharge found.    Musculoskeletal: Normal range of motion.  Neurological: She is alert and oriented to person, place, and time.  Skin: Skin is warm. Capillary refill takes less than 2 seconds.  Psychiatric: She has a normal mood and affect. Her behavior is normal.  Nursing note and vitals reviewed.    ED Treatments / Results  Labs (all labs ordered are listed, but only abnormal results are displayed) Labs Reviewed  WET PREP, GENITAL - Abnormal; Notable for the following:       Result Value   WBC, Wet Prep HPF POC FEW (*)    All other components within normal limits  CBC WITH DIFFERENTIAL/PLATELET - Abnormal; Notable for the following:    WBC 12.3 (*)    Hemoglobin 15.8 (*)    HCT 46.2 (*)    Platelets 402 (*)    Neutro Abs 8.6 (*)    All other components within normal limits  BASIC METABOLIC PANEL - Abnormal; Notable for the following:    Glucose, Bld 121 (*)    All other components within normal limits  URINALYSIS, ROUTINE W REFLEX MICROSCOPIC (NOT AT [facility]) -  Abnormal; Notable for the following:    Specific Gravity, Urine 1.004 (*)    Hgb urine dipstick MODERATE (*)    All other components within normal limits  URINE MICROSCOPIC-ADD ON - Abnormal; Notable for the following:    Squamous Epithelial / LPF 0-5 (*)    All other components within normal limits  RPR  HIV ANTIBODY (ROUTINE TESTING)  I-STAT BETA HCG BLOOD, ED ([facility], [facility], [facility] ONLY)  GC/CHLAMYDIA PROBE AMP ([place]) NOT AT [facility]    EKG  EKG Interpretation None       Radiology [place] Transvaginal Non-ob  Result Date: 06/01/2016 CLINICAL DATA:  Patient with pelvic pain for 3 days. Intrauterine device. EXAM: TRANSABDOMINAL AND TRANSVAGINAL ULTRASOUND OF PELVIS DOPPLER ULTRASOUND OF OVARIES TECHNIQUE: Both transabdominal and transvaginal ultrasound examinations of the pelvis were performed. Transabdominal technique was performed for global imaging of the pelvis including uterus, ovaries, adnexal regions, and pelvic cul-de-sac. It was necessary to proceed with endovaginal exam following the transabdominal  exam to visualize the endometrium and adnexal structures. Color and duplex Doppler ultrasound was utilized to evaluate blood flow to the ovaries. COMPARISON:  None. FINDINGS: Uterus Measurements: 8.0 x 3.6 x 4.1 cm. No fibroids or other mass visualized. Endometrium Thickness: 3 mm. No focal abnormality visualized. Intrauterine device is present and appears in appropriate position. Right ovary Measurements: 3.0 x 1.5 x 2.6 cm. Normal appearance/no adnexal mass. Left ovary Measurements: 3.1 x 1.8 x 1.9 cm. Normal appearance/no adnexal mass. Pulsed Doppler evaluation of both ovaries demonstrates normal low-resistance arterial and venous waveforms. Other findings No abnormal free fluid. IMPRESSION: Normal sonographic appearance of the ovaries bilaterally without evidence to suggest torsion. IUD appears in appropriate position. Electronically Signed   By: Annia Belt M.D.   On: 06/01/2016 11:44   US  Pelvis Complete  Result Date: 06/01/2016 CLINICAL DATA:  Patient with pelvic pain for 3 days. Intrauterine device. EXAM: TRANSABDOMINAL AND TRANSVAGINAL ULTRASOUND OF PELVIS DOPPLER ULTRASOUND OF OVARIES TECHNIQUE: Both transabdominal and transvaginal ultrasound examinations of the pelvis were performed. Transabdominal technique was performed for global imaging of the pelvis including uterus, ovaries, adnexal regions, and pelvic cul-de-sac. It was necessary to proceed with endovaginal exam following the transabdominal exam to visualize the endometrium and adnexal structures. Color and duplex Doppler ultrasound was utilized to evaluate blood flow to the ovaries. COMPARISON:  None. FINDINGS: Uterus Measurements: 8.0 x 3.6 x 4.1 cm. No fibroids or other mass visualized. Endometrium Thickness: 3 mm. No focal abnormality visualized. Intrauterine device is present and appears in appropriate position. Right ovary Measurements: 3.0 x 1.5 x 2.6 cm. Normal appearance/no adnexal mass. Left ovary Measurements: 3.1 x 1.8 x 1.9 cm. Normal appearance/no adnexal mass. Pulsed Doppler evaluation of both ovaries demonstrates normal low-resistance arterial and venous waveforms. Other findings No abnormal free fluid. IMPRESSION: Normal sonographic appearance of the ovaries bilaterally without evidence to suggest torsion. IUD appears in appropriate position. Electronically Signed   By: Annia Belt M.D.   On: 06/01/2016 11:44   [place] Art/ven Flow Abd Pelv Doppler  Result Date: 06/01/2016 CLINICAL DATA:  Patient with pelvic pain for 3 days. Intrauterine device. EXAM: TRANSABDOMINAL AND TRANSVAGINAL ULTRASOUND OF PELVIS DOPPLER ULTRASOUND OF OVARIES TECHNIQUE: Both transabdominal and transvaginal ultrasound examinations of the pelvis were performed. Transabdominal technique was performed for global imaging of the pelvis including uterus, ovaries, adnexal regions, and pelvic cul-de-sac. It was necessary to proceed with endovaginal exam  following the transabdominal exam to visualize the endometrium and adnexal structures. Color and duplex Doppler ultrasound was utilized to evaluate blood flow to the ovaries. COMPARISON:  None. FINDINGS: Uterus Measurements: 8.0 x 3.6 x 4.1 cm. No fibroids or other mass visualized. Endometrium Thickness: 3 mm. No focal abnormality visualized. Intrauterine device is present and appears in appropriate position. Right ovary Measurements: 3.0 x 1.5 x 2.6 cm. Normal appearance/no adnexal mass. Left ovary Measurements: 3.1 x 1.8 x 1.9 cm. Normal appearance/no adnexal mass. Pulsed Doppler evaluation of both ovaries demonstrates normal low-resistance arterial and venous waveforms. Other findings No abnormal free fluid. IMPRESSION: Normal sonographic appearance of the ovaries bilaterally without evidence to suggest torsion. IUD appears in appropriate position. Electronically Signed   By: Annia Belt M.D.   On: 06/01/2016 11:44    Procedures Procedures (including critical care time)  Medications Ordered in ED Medications - No data to display   Initial Impression / Assessment and Plan / ED Course  I have reviewed the triage vital signs and the nursing notes.  Pertinent labs &  imaging results that were available during my care of the patient were reviewed by me and considered in my medical decision making (see chart for details).  Clinical Course   Patient is a 25 year old female presenting with vaginal bleeding and pelvic pain 3 days. She had a C-section in December and a new IUD placed in January. She states that her normal menstrual cycle before IUD placement was heavy and after the IUD had been irregular and spotty. Breast-feeding was discontinued about 8 months ago. She states that she goes through about 5 pads a day for these past 3 days. She denied trauma, chest pain, shortness of breath, urinary symptoms, dyschezia, history of STI, and vaginal discharge. On pelvic examination no active bleeding was  present, however there was evidence of dry blood. IUD placement intact. No discharge. Ultrasound ruled out ovarian torsion, cysts, fibroids, and any other abnormality. Vaginal bleeding and pelvic pain may be due to hormonal changes due to recent pregnancy, discontinuation of breast-feeding, and IUD placement. Physical exam and lab studies show no anemia or infectious processes. Patient was reassured with findings and referred to women's clinic to continue workup for cause of vaginal bleeding and pelvic pain. Patient understood and agreed with findings. Discharge instructions given. Told to return for new or worsening symptoms.  Final Clinical Impressions(s) / ED Diagnoses   Final diagnoses:  Vaginal bleeding  Pelvic pain    New Prescriptions Discharge Medication List as of 06/01/2016 12:24 PM       [address] EustisEsp[place], [place] 06/01/16 1356    Nelva Nayobert Beaton, MD 06/18/16 [phone]

## 2016-06-01 NOTE — Discharge Instructions (Signed)
Please see below instructions.  SEEK MEDICAL CARE IF:   Your bleeding lasts more than 1 week.   You feel dizzy at times.  SEEK IMMEDIATE MEDICAL CARE IF:   You pass out.   You are changing pads every 15 to 30 minutes.   You have abdominal pain.  You have a fever.   You become sweaty or weak.   You are passing large blood clots from the vagina.   You start to feel nauseous and vomit.

## 2016-06-01 NOTE — ED Notes (Signed)
Provider at bedside

## 2016-06-01 NOTE — ED Notes (Signed)
Patient notified need blood draw and then will be discharged. Verbalized understanding.

## 2016-06-01 NOTE — ED Notes (Signed)
Patient transported to Ultrasound 

## 2016-06-01 NOTE — ED Triage Notes (Signed)
Patient complains of 3 days of heavy vaginal bleeding and left side pain. States had IUD placed in December and has not had any episodes of heavy bleeding since placement. Denies clots. Pale on arrival. Alert and oriented

## 2016-06-02 LAB — RPR: RPR: NONREACTIVE

## 2016-06-02 LAB — HIV ANTIBODY (ROUTINE TESTING W REFLEX): HIV SCREEN 4TH GENERATION: NONREACTIVE

## 2016-06-02 LAB — GC/CHLAMYDIA PROBE AMP (~~LOC~~) NOT AT ARMC
CHLAMYDIA, DNA PROBE: NEGATIVE
Neisseria Gonorrhea: NEGATIVE

## 2017-09-25 ENCOUNTER — Encounter: Payer: Self-pay | Admitting: *Deleted

## 2018-04-22 IMAGING — US US ART/VEN ABD/PELV/SCROTUM DOPPLER LTD
1 series · 13 of 25 positions shown · non-contrast
Comparison: None.

CLINICAL DATA: Patient with pelvic pain for 3 days. Intrauterine
device.

EXAM:
TRANSABDOMINAL AND TRANSVAGINAL ULTRASOUND OF PELVIS
DOPPLER ULTRASOUND OF OVARIES
TECHNIQUE: Both transabdominal and transvaginal ultrasound examinations of the
pelvis were performed. Transabdominal technique was performed for
global imaging of the pelvis including uterus, ovaries, adnexal
regions, and pelvic cul-de-sac.
It was necessary to proceed with endovaginal exam following the
transabdominal exam to visualize the endometrium and adnexal
structures. Color and duplex Doppler ultrasound was utilized to
evaluate blood flow to the ovaries.

[Series 1: us art/ven abd/pelv/scrotum doppler ltd · 0.21mm/px · 64 acquisitions, 13 frames shown]
[im 1/64]
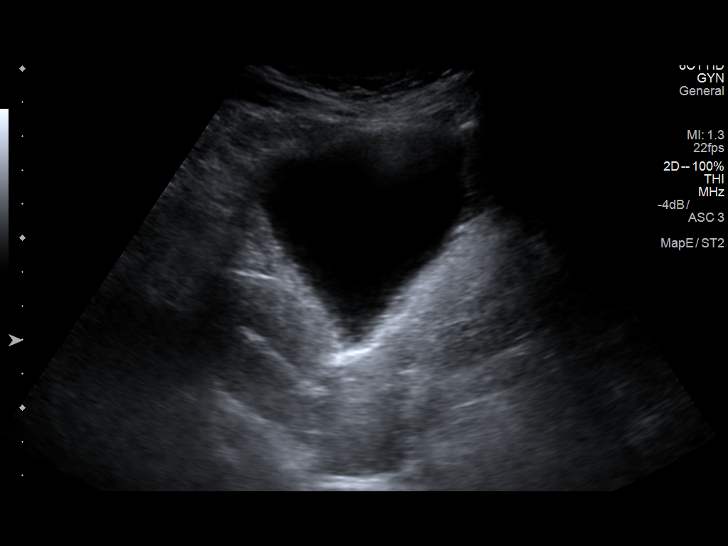
[im 6/64]
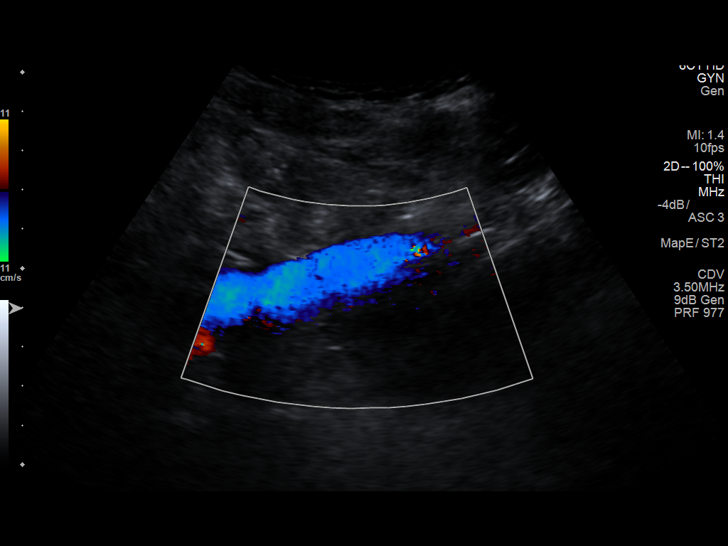
[im 11/64]
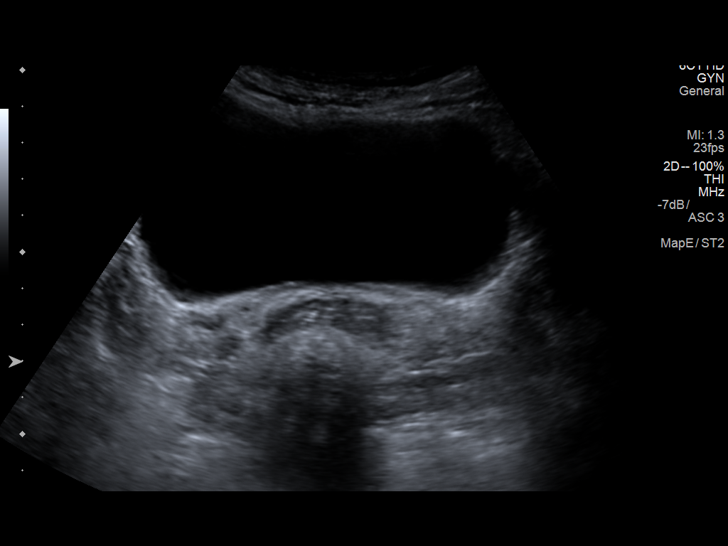
[im 16/64]
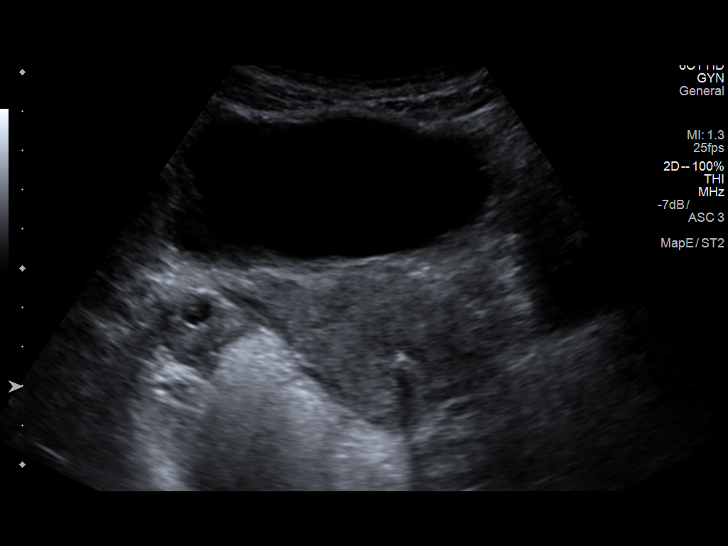
[im 22/64]
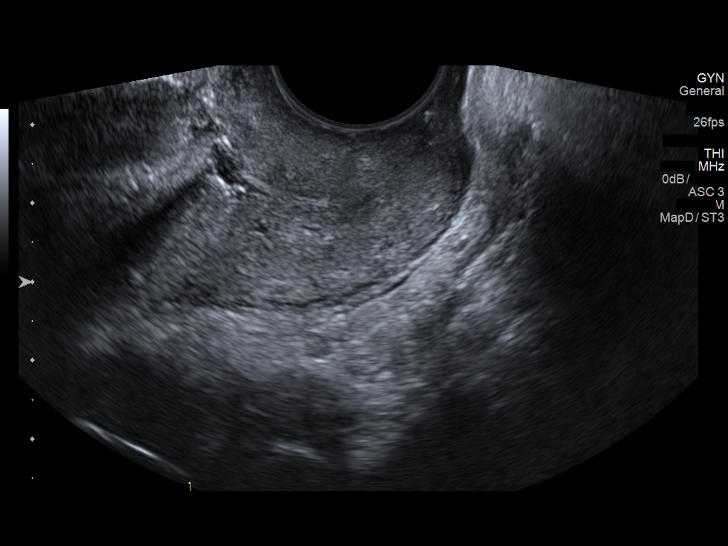
[im 27/64]
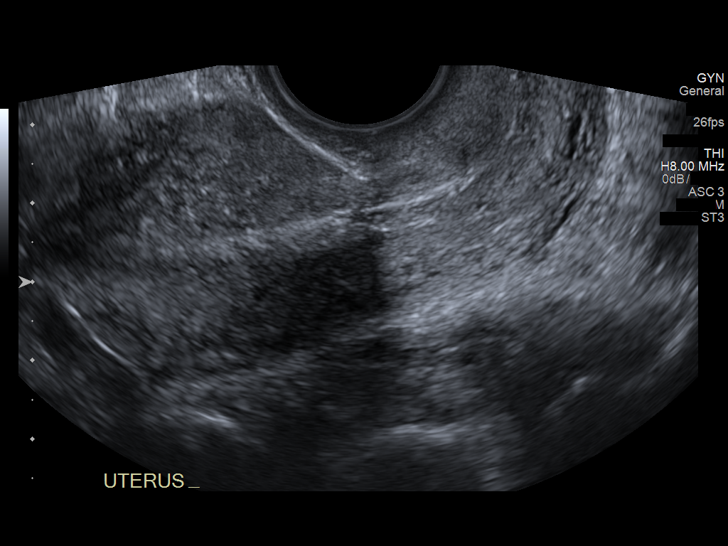
[im 32/64]
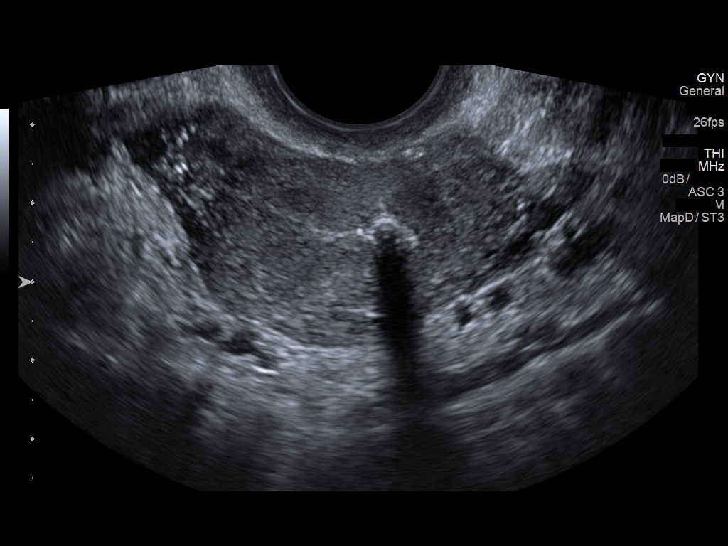
[im 37/64]
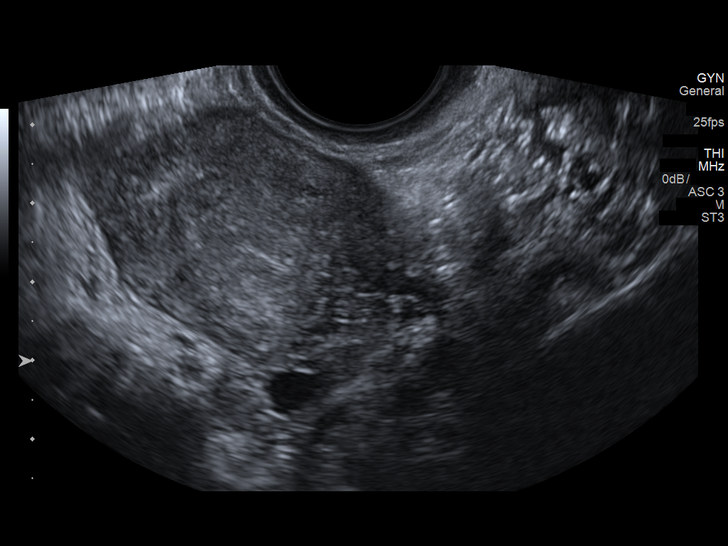
[im 43/64]
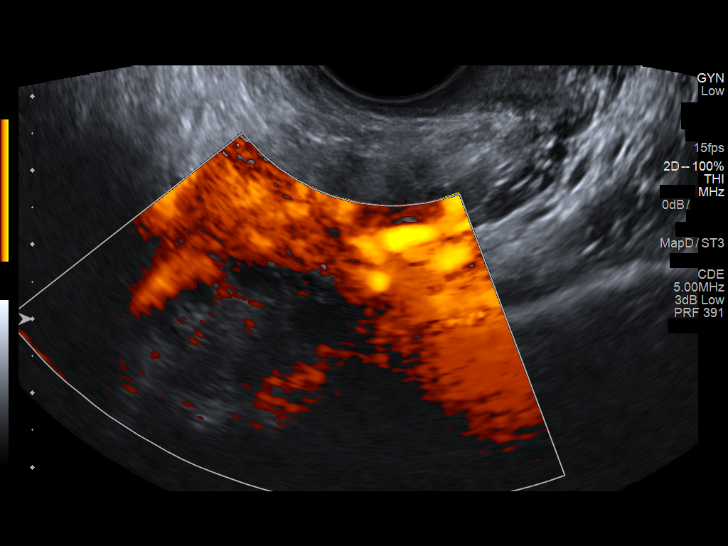
[im 48/64]
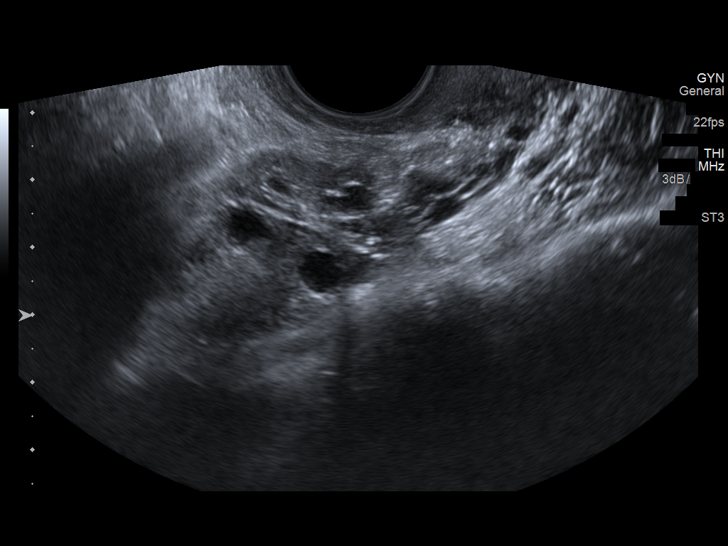
[im 53/64]
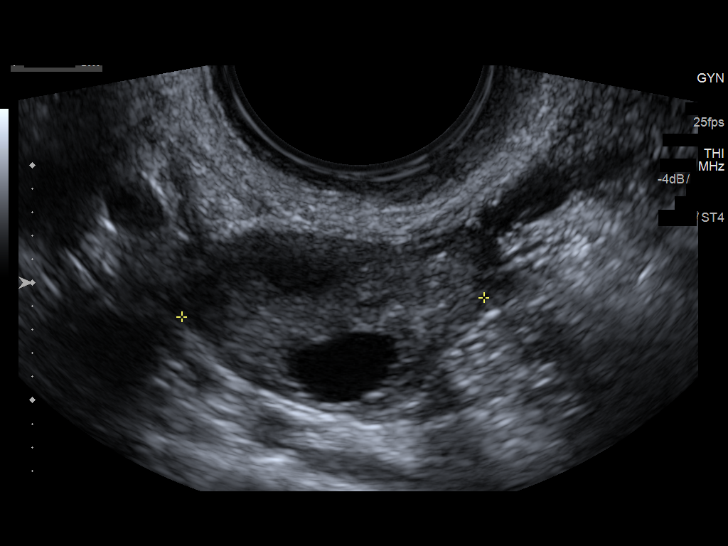
[im 58/64]
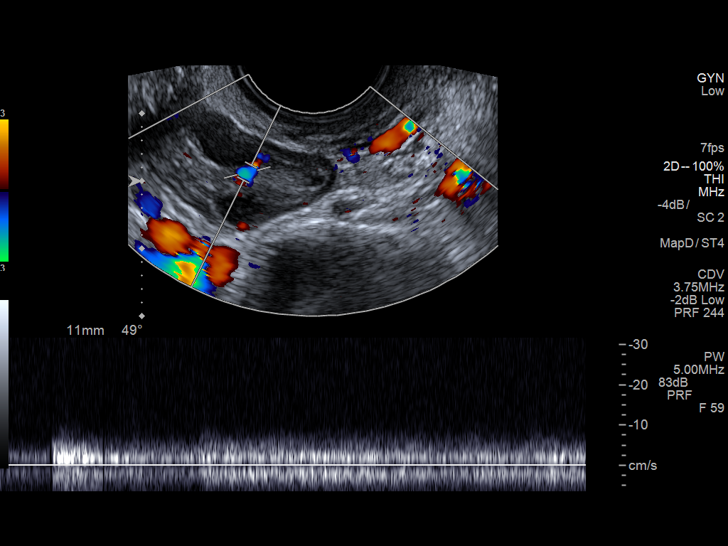
[im 64/64]
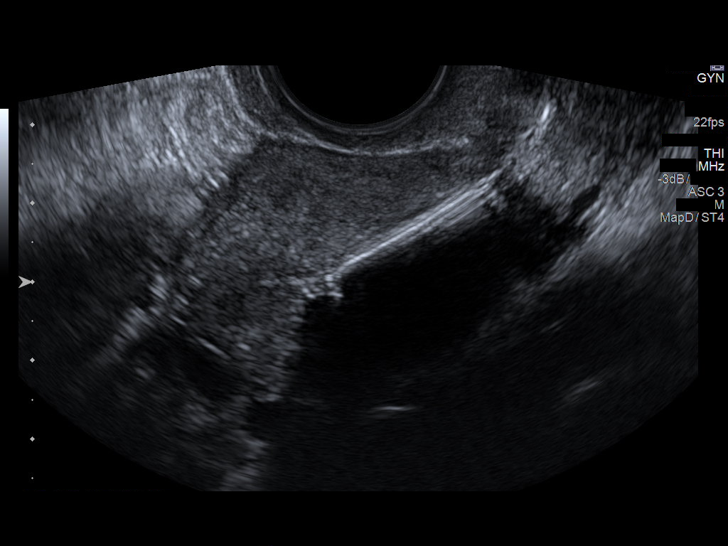

[13 of 25 positions shown; findings below may reference images not displayed]

FINDINGS: Uterus

Measurements: 8.0 x 3.6 x 4.1 cm. No fibroids or other mass
visualized.

Endometrium

Thickness: 3 mm. No focal abnormality visualized. Intrauterine
device is present and appears in appropriate position.

Right ovary

Measurements: 3.0 x 1.5 x 2.6 cm. Normal appearance/no adnexal mass.

Left ovary

Measurements: 3.1 x 1.8 x 1.9 cm. Normal appearance/no adnexal mass.

Pulsed Doppler evaluation of both ovaries demonstrates normal
low-resistance arterial and venous waveforms.

Other findings

No abnormal free fluid.
IMPRESSION: Normal sonographic appearance of the ovaries bilaterally without
evidence to suggest torsion.

IUD appears in appropriate position.

## 2019-03-14 ENCOUNTER — Encounter: Payer: Self-pay | Admitting: Obstetrics and Gynecology

## 2019-03-14 ENCOUNTER — Ambulatory Visit: Payer: Self-pay | Admitting: Advanced Practice Midwife
